# Patient Record
Sex: Female | Born: 1990 | Race: Black or African American | Hispanic: No | Marital: Single | Smoking: Never smoker
Health system: Southern US, Community
[De-identification: ages and names within clinical notes are randomized; demographics above are authoritative.]

## PROBLEM LIST (undated history)

## (undated) DIAGNOSIS — N760 Acute vaginitis: Secondary | ICD-10-CM

## (undated) DIAGNOSIS — A749 Chlamydial infection, unspecified: Secondary | ICD-10-CM

## (undated) DIAGNOSIS — A5901 Trichomonal vulvovaginitis: Secondary | ICD-10-CM

## (undated) DIAGNOSIS — N730 Acute parametritis and pelvic cellulitis: Secondary | ICD-10-CM

## (undated) DIAGNOSIS — B9689 Other specified bacterial agents as the cause of diseases classified elsewhere: Secondary | ICD-10-CM

---
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
## 2010-02-14 ENCOUNTER — Emergency Department (HOSPITAL_COMMUNITY): Admission: EM | Admit: 2010-02-14 | Discharge: 2010-02-14 | Payer: Self-pay | Admitting: Emergency Medicine

## 2010-06-05 LAB — HM PAP SMEAR

## 2010-09-07 LAB — URINALYSIS, ROUTINE W REFLEX MICROSCOPIC
Bilirubin Urine: NEGATIVE
Glucose, UA: NEGATIVE mg/dL
Hgb urine dipstick: NEGATIVE
Ketones, ur: NEGATIVE mg/dL
Nitrite: NEGATIVE
Protein, ur: NEGATIVE mg/dL
Specific Gravity, Urine: 1.025 (ref 1.005–1.030)
Urobilinogen, UA: 0.2 mg/dL (ref 0.0–1.0)

## 2010-09-07 LAB — POCT PREGNANCY, URINE: Preg Test, Ur: NEGATIVE

## 2010-09-07 LAB — WET PREP, GENITAL

## 2010-09-07 LAB — URINE MICROSCOPIC-ADD ON

## 2010-11-21 ENCOUNTER — Emergency Department (HOSPITAL_COMMUNITY)
Admission: EM | Admit: 2010-11-21 | Discharge: 2010-11-21 | Disposition: A | Discharge status: Home / Self Care | Payer: Medicaid Other | Attending: Emergency Medicine | Admitting: Emergency Medicine

## 2010-11-21 DIAGNOSIS — A5901 Trichomonal vulvovaginitis: Secondary | ICD-10-CM | POA: Insufficient documentation

## 2010-11-21 DIAGNOSIS — N72 Inflammatory disease of cervix uteri: Secondary | ICD-10-CM | POA: Insufficient documentation

## 2010-11-21 LAB — POCT PREGNANCY, URINE
Preg Test, Ur: NEGATIVE
Preg Test, Ur: NEGATIVE

## 2010-11-21 LAB — WET PREP, GENITAL: Yeast Wet Prep HPF POC: NONE SEEN

## 2010-11-21 LAB — URINALYSIS, ROUTINE W REFLEX MICROSCOPIC
Glucose, UA: NEGATIVE mg/dL
Leukocytes, UA: NEGATIVE
pH: 6 (ref 5.0–8.0)

## 2011-04-18 ENCOUNTER — Emergency Department (INDEPENDENT_AMBULATORY_CARE_PROVIDER_SITE_OTHER)
Admission: EM | Admit: 2011-04-18 | Discharge: 2011-04-18 | Disposition: A | Discharge status: Home / Self Care | Payer: Medicaid Other | Source: Home / Self Care

## 2011-04-18 ENCOUNTER — Encounter: Payer: Self-pay | Admitting: Emergency Medicine

## 2011-04-18 DIAGNOSIS — B9689 Other specified bacterial agents as the cause of diseases classified elsewhere: Secondary | ICD-10-CM

## 2011-04-18 DIAGNOSIS — N939 Abnormal uterine and vaginal bleeding, unspecified: Secondary | ICD-10-CM

## 2011-04-18 DIAGNOSIS — N898 Other specified noninflammatory disorders of vagina: Secondary | ICD-10-CM

## 2011-04-18 DIAGNOSIS — A499 Bacterial infection, unspecified: Secondary | ICD-10-CM

## 2011-04-18 DIAGNOSIS — N760 Acute vaginitis: Secondary | ICD-10-CM

## 2011-04-18 HISTORY — DX: Acute vaginitis: N76.0

## 2011-04-18 HISTORY — DX: Trichomonal vulvovaginitis: A59.01

## 2011-04-18 HISTORY — DX: Acute vaginitis: B96.89

## 2011-04-18 HISTORY — DX: Chlamydial infection, unspecified: A74.9

## 2011-04-18 LAB — POCT PREGNANCY, URINE: Preg Test, Ur: NEGATIVE

## 2011-04-18 LAB — POCT URINALYSIS DIP (DEVICE)
Glucose, UA: NEGATIVE mg/dL
Nitrite: NEGATIVE
Urobilinogen, UA: 0.2 mg/dL (ref 0.0–1.0)

## 2011-04-18 LAB — WET PREP, GENITAL

## 2011-04-18 MED ORDER — METRONIDAZOLE 500 MG PO TABS: 500.0000 mg | ORAL_TABLET | Freq: Two times a day (BID) | ORAL | Status: AC

## 2011-04-18 NOTE — ED Provider Notes (Signed)
History     CSN: [id number] Arrival date & time: 04/18/2011  6:53 PM   First MD Initiated Contact with Patient 04/18/11 1707      Chief Complaint  Patient presents with  . Abdominal Pain    pt. stated, I started having left side abd. pain since yesterday.     (Consider location/radiation/quality/duration/timing/severity/associated sxs/prior treatment) HPI Comments: Pt with achy nonradiating LLQ pain starting last night. Took ibu w/o relief. Oderous vaginal d/c for several days. Today vaginal bleeding, No N/V, fevers, diarrhea, contipation, lat BM yesterday was WNL for her. No vaginal itching, lower back pain, . No urgency, frequency, dysuria,oderous urine, hematuria,  genital blisters, itching. No recent abx use. Pt sexually active with female partner last contact 1 month ago used condoms, and a female partner last contact 2 weeks ago. States did not share sex toys. They are both asxatic.  STD's not a concern today. States she occasionally gets this LLQ pain since having chlamydia 2 years ago. Seen this summer in ED for similar pain, vaginal bleeding, thought to have abnormal vaginal bleesing.  H/o chalmydia, BV, trich.  No h/o syphilis, herpes, HIV. LMP ended 04/09/2011   Patient is a 20 y.o. female presenting with abdominal pain.  Abdominal Pain The primary symptoms of the illness include abdominal pain, vaginal discharge and vaginal bleeding. The primary symptoms of the illness do not include fever, shortness of breath, nausea, vomiting, diarrhea, hematemesis, hematochezia or dysuria. The current episode started yesterday. The onset of the illness was gradual. The problem has not changed since onset. The vaginal discharge is not associated with dysuria.   The patient states that she believes she is currently not pregnant. Symptoms associated with the illness do not include constipation, urgency, hematuria, frequency or back pain.    Past Medical History  Diagnosis Date  . Chlamydia   .  BV (bacterial vaginosis)   . Trichomonas vaginitis     History reviewed. No pertinent past surgical history.  History reviewed. No pertinent family history.  History  Substance Use Topics  . Smoking status: Never Smoker   . Smokeless tobacco: Not on file  . Alcohol Use: 0.6 oz/week    1 Shots of liquor per week    OB History    Grav Para Term Preterm Abortions TAB SAB Ect Mult Living                  Review of Systems  Constitutional: Negative for fever.  HENT: Negative for sore throat.   Eyes: Negative for redness.  Respiratory: Negative for cough and shortness of breath.   Cardiovascular: Negative for chest pain.  Gastrointestinal: Positive for abdominal pain. Negative for nausea, vomiting, diarrhea, constipation, blood in stool, hematochezia, anal bleeding and hematemesis.  Genitourinary: Positive for vaginal bleeding, vaginal discharge and pelvic pain. Negative for dysuria, urgency, frequency, hematuria, flank pain, vaginal pain and menstrual problem.  Musculoskeletal: Negative for back pain.  Skin: Negative for rash.    Allergies  Review of patient's allergies indicates no known allergies.  Home Medications   Current Outpatient Rx  Name Route Sig Dispense Refill  . IBUPROFEN 600 MG PO TABS Oral Take 600 mg by mouth every 6 (six) hours as needed.      Marland Kitchen METRONIDAZOLE 500 MG PO TABS Oral Take 1 tablet (500 mg total) by mouth 2 (two) times daily. 14 tablet 0    BP 131/92  Pulse 92  Temp(Src) 98.5 F (36.9 C) (Oral)  Resp 18  SpO2  99%  Physical Exam  Nursing note and vitals reviewed. Constitutional: She is oriented to person, place, and time. She appears well-developed and well-nourished. No distress.  HENT:  Head: Normocephalic and atraumatic.  Eyes: EOM are normal. Pupils are equal, round, and reactive to light.  Neck: Normal range of motion. Neck supple.  Cardiovascular: Normal rate, regular rhythm and normal heart sounds.   Pulmonary/Chest: Effort  normal and breath sounds normal.  Abdominal: Soft. Bowel sounds are normal. She exhibits no distension. There is no tenderness. There is no rebound and no guarding.  Genitourinary: Uterus normal. Pelvic exam was performed with patient prone. There is no rash on the right labia. There is no rash on the left labia. Uterus is not tender. Cervix exhibits discharge. Cervix exhibits no motion tenderness and no friability. Right adnexum displays no mass, no tenderness and no fullness. Left adnexum displays no mass, no tenderness and no fullness. There is bleeding around the vagina. No erythema or tenderness around the vagina. No foreign body around the vagina. Vaginal discharge found.       Uterus regular, nontender. No CMT. Mild L adnexal tenderness. No masses. Chaperone present during exam  Musculoskeletal: Normal range of motion.  Neurological: She is alert and oriented to person, place, and time.  Skin: Skin is warm and dry.  Psychiatric: She has a normal mood and affect. Her behavior is normal. Judgment and thought content normal.    ED Course  Procedures (including critical care time)  Results for orders placed during the hospital encounter of 04/18/11  POCT URINALYSIS DIP (DEVICE)      Component Value Range   Glucose, UA NEGATIVE  NEGATIVE (mg/dL)   Bilirubin Urine NEGATIVE  NEGATIVE    Ketones, ur NEGATIVE  NEGATIVE (mg/dL)   Specific Gravity, Urine 1.015  1.005 - 1.030    Hgb urine dipstick LARGE (*) NEGATIVE    pH 8.5 (*) 5.0 - 8.0    Protein, ur 30 (*) NEGATIVE (mg/dL)   Urobilinogen, UA 0.2  0.0 - 1.0 (mg/dL)   Nitrite NEGATIVE  NEGATIVE    Leukocytes, UA NEGATIVE  NEGATIVE   POCT PREGNANCY, URINE      Component Value Range   Preg Test, Ur NEGATIVE       1. Vaginal bleeding, abnormal   2. BV (bacterial vaginosis)       MDM        Danella Maiers [facility] 04/18/11 2029

## 2011-04-19 LAB — GC/CHLAMYDIA PROBE AMP, GENITAL: Chlamydia, DNA Probe: NEGATIVE

## 2011-06-21 ENCOUNTER — Ambulatory Visit (HOSPITAL_COMMUNITY)
Admission: RE | Admit: 2011-06-21 | Discharge: 2011-06-21 | Disposition: A | Discharge status: Home / Self Care | Payer: Medicaid Other | Source: Ambulatory Visit | Attending: Chiropractic Medicine | Admitting: Chiropractic Medicine

## 2011-06-21 ENCOUNTER — Other Ambulatory Visit (HOSPITAL_COMMUNITY): Payer: Self-pay | Admitting: Chiropractic Medicine

## 2011-06-21 DIAGNOSIS — M542 Cervicalgia: Secondary | ICD-10-CM

## 2012-06-26 ENCOUNTER — Other Ambulatory Visit (INDEPENDENT_AMBULATORY_CARE_PROVIDER_SITE_OTHER): Payer: No Typology Code available for payment source

## 2012-06-26 ENCOUNTER — Encounter: Payer: Self-pay | Admitting: Internal Medicine

## 2012-06-26 ENCOUNTER — Ambulatory Visit (INDEPENDENT_AMBULATORY_CARE_PROVIDER_SITE_OTHER): Payer: No Typology Code available for payment source | Admitting: Internal Medicine

## 2012-06-26 ENCOUNTER — Other Ambulatory Visit: Payer: Self-pay | Admitting: Internal Medicine

## 2012-06-26 VITALS — BP 116/86 | HR 84 | Temp 97.7°F | Ht 62.0 in | Wt 166.0 lb

## 2012-06-26 DIAGNOSIS — Z0000 Encounter for general adult medical examination without abnormal findings: Secondary | ICD-10-CM

## 2012-06-26 DIAGNOSIS — Z13220 Encounter for screening for lipoid disorders: Secondary | ICD-10-CM

## 2012-06-26 DIAGNOSIS — Z131 Encounter for screening for diabetes mellitus: Secondary | ICD-10-CM

## 2012-06-26 DIAGNOSIS — Z130 Encounter for screening for diseases of the blood and blood-forming organs and certain disorders involving the immune mechanism: Secondary | ICD-10-CM

## 2012-06-26 DIAGNOSIS — B9689 Other specified bacterial agents as the cause of diseases classified elsewhere: Secondary | ICD-10-CM

## 2012-06-26 DIAGNOSIS — A499 Bacterial infection, unspecified: Secondary | ICD-10-CM

## 2012-06-26 DIAGNOSIS — N760 Acute vaginitis: Secondary | ICD-10-CM

## 2012-06-26 DIAGNOSIS — R109 Unspecified abdominal pain: Secondary | ICD-10-CM

## 2012-06-26 LAB — LIPID PANEL
Cholesterol: 197 mg/dL (ref 0–200)
LDL Cholesterol: 122 mg/dL — ABNORMAL HIGH (ref 0–99)
Triglycerides: 50 mg/dL (ref 0.0–149.0)

## 2012-06-26 LAB — CBC
Hemoglobin: 11.4 g/dL — ABNORMAL LOW (ref 12.0–15.0)
MCHC: 32.5 g/dL (ref 30.0–36.0)
MCV: 74.8 fl — ABNORMAL LOW (ref 78.0–100.0)
RDW: 17.9 % — ABNORMAL HIGH (ref 11.5–14.6)

## 2012-06-26 LAB — BASIC METABOLIC PANEL
Chloride: 104 mEq/L (ref 96–112)
Creatinine, Ser: 0.5 mg/dL (ref 0.4–1.2)
GFR: 198.37 mL/min (ref >60.00)

## 2012-06-26 MED ORDER — METRONIDAZOLE 0.75 % VA GEL: 1.0000 | Freq: Two times a day (BID) | VAGINAL | Status: DC

## 2012-06-26 NOTE — Progress Notes (Signed)
HPI  Pt presents to the clinic today to establish care. [facility] but now that she's 22 she has to establish a PCP. She does have some concerns today about pain in her left abdomen. She thinks she may have a cyst on her ovary. She has pain during intercourse and the area feels swollen. She has take Ibuprofen for the pain with mild relief. 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 This is a chronic problem for her. She has used Mudlogger on multiple occasions.   Flu: never Tdap: 2009 LMP: 06/11/12 Pap smear: 2012  Past Medical History  Diagnosis Date  . Chlamydia   . BV (bacterial vaginosis)   . Trichomonas vaginitis     Current Outpatient Prescriptions  Medication Sig Dispense Refill  . metroNIDAZOLE (METROGEL) 0.75 % vaginal gel Place 1 Applicatorful vaginally 2 (two) times daily.  70 g  0    No Known Allergies  Family History  Problem Relation Age of Onset  . Hypertension Mother   . Glaucoma Mother   . Cancer Maternal Uncle   . Diabetes Maternal Grandmother     History   Social History  . Marital Status: Single    Spouse Name: N/A    Number of Children: N/A  . Years of Education: 15   Occupational History  . [employer]   Social History Main Topics  . Smoking status: Never Smoker   . Smokeless tobacco: Never Used  . Alcohol Use: 0.6 oz/week    1 Shots of liquor per week  . Drug Use: No  . Sexually Active: Yes    Birth Control/ Protection: Condom   Other Topics Concern  . Not on file   Social History Narrative   Regular exercise-noCaffeine Us-yes    ROS:  Constitutional: Denies fever, malaise, fatigue, headache or abrupt weight changes.  HEENT: Denies eye pain, eye redness, ear pain, ringing in the ears, wax buildup, runny nose, nasal congestion, bloody nose, or sore throat. Respiratory: Denies difficulty breathing, shortness of breath, cough or sputum production.   Cardiovascular: Denies chest pain, chest  tightness, palpitations or swelling in the hands or feet.  Gastrointestinal: Denies abdominal pain, bloating, constipation, diarrhea or blood in the stool.  GU: Pt reports pain in her left ovary. Pt reports creamy thin discharge with foul odor. Denies frequency, urgency, pain with urination, blood in urine. Musculoskeletal: Denies decrease in range of motion, difficulty with gait, muscle pain or joint pain and swelling.  Skin: Denies redness, rashes, lesions or ulcercations.  Neurological: Denies dizziness, difficulty with memory, difficulty with speech or problems with balance and coordination.   No other specific complaints in a complete review of systems (except as listed in HPI above).  PE:  BP 116/86  Pulse 84  Temp 97.7 F (36.5 C) (Oral)  Ht 5\' 2"  (1.575 m)  Wt 166 lb (75.297 kg)  BMI 30.36 kg/m2  SpO2 99%  LMP 06/11/2012 Wt Readings from Last 3 Encounters:  06/26/12 166 lb (75.297 kg)    General: Appears her stated age, overweight but well developed, well nourished in NAD. HEENT: Head: normal shape and size; Eyes: sclera white, no icterus, conjunctiva pink, PERRLA and EOMs intact; Ears: Tm's gray and intact, normal light reflex; Nose: mucosa pink and moist, septum midline; Throat/Mouth: Teeth present, mucosa pink and moist, no lesions or ulcerations noted.  Neck: Normal range of motion. Neck supple, trachea midline. No massses, lumps or thyromegaly present.  Cardiovascular: Normal rate and rhythm. S1,S2 noted.  No murmur, rubs or gallops noted. No JVD or BLE edema. No carotid bruits noted. Pulmonary/Chest: Normal effort and positive vesicular breath sounds. No respiratory distress. No wheezes, rales or ronchi noted.  Abdomen: Soft. Rebound tenderness of the LLQ. Normal bowel sounds, no bruits noted. No distention or masses noted. Liver, spleen and kidneys non palpable. Musculoskeletal: Normal range of motion. No signs of joint swelling. No difficulty with gait.  Neurological:  Alert and oriented. Cranial nerves II-XII intact. Coordination normal. +DTRs bilaterally. Psychiatric: Mood and affect normal. Behavior is normal. Judgment and thought content normal.     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

## 2012-06-26 NOTE — Patient Instructions (Signed)
Health Maintenance, Females A healthy lifestyle and preventative care can promote health and wellness.  Maintain regular health, dental, and eye exams.  Eat a healthy diet. Foods like vegetables, fruits, whole grains, low-fat dairy products, and lean protein foods contain the nutrients you need without too many calories. Decrease your intake of foods high in solid fats, added sugars, and salt. Get information about a proper diet from your caregiver, if necessary.  Regular physical exercise is one of the most important things you can do for your health. Most adults should get at least 150 minutes of moderate-intensity exercise (any activity that increases your heart rate and causes you to sweat) each week. In addition, most adults need muscle-strengthening exercises on 2 or more days a week.   Maintain a healthy weight. The body mass index (BMI) is a screening tool to identify possible weight problems. It provides an estimate of body fat based on height and weight. Your caregiver can help determine your BMI, and can help you achieve or maintain a healthy weight. For adults 20 years and older:  A BMI below 18.5 is considered underweight.  A BMI of 18.5 to 24.9 is normal.  A BMI of 25 to 29.9 is considered overweight.  A BMI of 30 and above is considered obese.  Maintain normal blood lipids and cholesterol by exercising and minimizing your intake of saturated fat. Eat a balanced diet with plenty of fruits and vegetables. Blood tests for lipids and cholesterol should begin at age 20 and be repeated every 5 years. If your lipid or cholesterol levels are high, you are over 50, or you are a high risk for heart disease, you may need your cholesterol levels checked more frequently.Ongoing high lipid and cholesterol levels should be treated with medicines if diet and exercise are not effective.  If you smoke, find out from your caregiver how to quit. If you do not use tobacco, do not start.  If you  are pregnant, do not drink alcohol. If you are breastfeeding, be very cautious about drinking alcohol. If you are not pregnant and choose to drink alcohol, do not exceed 1 drink per day. One drink is considered to be 12 ounces (355 mL) of beer, 5 ounces (148 mL) of wine, or 1.5 ounces (44 mL) of liquor.  Avoid use of street drugs. Do not share needles with anyone. Ask for help if you need support or instructions about stopping the use of drugs.  High blood pressure causes heart disease and increases the risk of stroke. Blood pressure should be checked at least every 1 to 2 years. Ongoing high blood pressure should be treated with medicines, if weight loss and exercise are not effective.  If you are 55 to 22 years old, ask your caregiver if you should take aspirin to prevent strokes.  Diabetes screening involves taking a blood sample to check your fasting blood sugar level. This should be done once every 3 years, after age 45, if you are within normal weight and without risk factors for diabetes. Testing should be considered at a younger age or be carried out more frequently if you are overweight and have at least 1 risk factor for diabetes.  Breast cancer screening is essential preventative care for women. You should practice "breast self-awareness." This means understanding the normal appearance and feel of your breasts and may include breast self-examination. Any changes detected, no matter how small, should be reported to a caregiver. Women in their 20s and 30s should have   a clinical breast exam (CBE) by a caregiver as part of a regular health exam every 1 to 3 years. After age 40, women should have a CBE every year. Starting at age 40, women should consider having a mammogram (breast X-ray) every year. Women who have a family history of breast cancer should talk to their caregiver about genetic screening. Women at a high risk of breast cancer should talk to their caregiver about having an MRI and a  mammogram every year.  The Pap test is a screening test for cervical cancer. Women should have a Pap test starting at age 21. Between ages 21 and 29, Pap tests should be repeated every 2 years. Beginning at age 30, you should have a Pap test every 3 years as long as the past 3 Pap tests have been normal. If you had a hysterectomy for a problem that was not cancer or a condition that could lead to cancer, then you no longer need Pap tests. If you are between ages 65 and 70, and you have had normal Pap tests going back 10 years, you no longer need Pap tests. If you have had past treatment for cervical cancer or a condition that could lead to cancer, you need Pap tests and screening for cancer for at least 20 years after your treatment. If Pap tests have been discontinued, risk factors (such as a new sexual partner) need to be reassessed to determine if screening should be resumed. Some women have medical problems that increase the chance of getting cervical cancer. In these cases, your caregiver may recommend more frequent screening and Pap tests.  The human papillomavirus (HPV) test is an additional test that may be used for cervical cancer screening. The HPV test looks for the virus that can cause the cell changes on the cervix. The cells collected during the Pap test can be tested for HPV. The HPV test could be used to screen women aged 30 years and older, and should be used in women of any age who have unclear Pap test results. After the age of 30, women should have HPV testing at the same frequency as a Pap test.  Colorectal cancer can be detected and often prevented. Most routine colorectal cancer screening begins at the age of 50 and continues through age 75. However, your caregiver may recommend screening at an earlier age if you have risk factors for colon cancer. On a yearly basis, your caregiver may provide home test kits to check for hidden blood in the stool. Use of a small camera at the end of a  tube, to directly examine the colon (sigmoidoscopy or colonoscopy), can detect the earliest forms of colorectal cancer. Talk to your caregiver about this at age 50, when routine screening begins. Direct examination of the colon should be repeated every 5 to 10 years through age 75, unless early forms of pre-cancerous polyps or small growths are found.  Hepatitis C blood testing is recommended for all people born from 1945 through 1965 and any individual with known risks for hepatitis C.  Practice safe sex. Use condoms and avoid high-risk sexual practices to reduce the spread of sexually transmitted infections (STIs). Sexually active women aged 25 and younger should be checked for Chlamydia, which is a common sexually transmitted infection. Older women with new or multiple partners should also be tested for Chlamydia. Testing for other STIs is recommended if you are sexually active and at increased risk.  Osteoporosis is a disease in which the   bones lose minerals and strength with aging. This can result in serious bone fractures. The risk of osteoporosis can be identified using a bone density scan. Women ages 65 and over and women at risk for fractures or osteoporosis should discuss screening with their caregivers. Ask your caregiver whether you should be taking a calcium supplement or vitamin D to reduce the rate of osteoporosis.  Menopause can be associated with physical symptoms and risks. Hormone replacement therapy is available to decrease symptoms and risks. You should talk to your caregiver about whether hormone replacement therapy is right for you.  Use sunscreen with a sun protection factor (SPF) of 30 or greater. Apply sunscreen liberally and repeatedly throughout the day. You should seek shade when your shadow is shorter than you. Protect yourself by wearing long sleeves, pants, a wide-brimmed hat, and sunglasses year round, whenever you are outdoors.  Notify your caregiver of new moles or  changes in moles, especially if there is a change in shape or color. Also notify your caregiver if a mole is larger than the size of a pencil eraser.  Stay current with your immunizations. Document Released: 12/05/2010 Document Revised: 08/14/2011 Document Reviewed: 12/05/2010 Exit[employer] Patient Information 2013 [employer]. Breast Self-Exam A self breast exam may help you find changes or problems while they are still small. Do a breast self-exam:  Every month.  One week after your period (menstrual period).  On the first day of each month if you do not have periods anymore. Look for any:  Change in breast color, size, or shape.  Dimples in your breast.  Changes in your nipples or skin.  Dry skin on your breasts or nipples.  Watery or bloody discharge from your nipples.  Feel for:  Lumps.  Thick, hard places.  Any other changes. HOME CARE There are 3 ways to do the breast self-exam: In front of a mirror.  Lift your arms over your head and turn side to side.  Put your hands on your hips and lean down, then turn from side to side.  Bend forward and turn from side to side. In the shower.  With soapy hands, check both breasts. Then check above and below your collarbone and your armpits.  Feel above and below your collarbone down to under your breast, and from the center of your chest to the outer edge of the armpit. Check for any lumps or hard spots.  Using the tips of your middle three fingers check your whole breast by pressing your hand over your breast in a circle or in an up and down motion. Lying down.  Lie flat on your bed.  Put a small pillow under the breast you are going to check. On that same side, put your hand behind your head.  With your other hand, use the 3 middle fingers to feel the breast.  Move your fingers in a circle around the breast. Press firmly over all parts of the breast to feel for any lumps. GET HELP RIGHT AWAY IF: You find any  changes in your breasts so they can be checked. Document Released: 11/08/2007 Document Revised: 08/14/2011 Document Reviewed: 09/09/2008 [employer] Patient Information 2013 [employer].  

## 2012-07-09 ENCOUNTER — Other Ambulatory Visit: Payer: Self-pay | Admitting: Internal Medicine

## 2012-07-09 ENCOUNTER — Ambulatory Visit (HOSPITAL_COMMUNITY)
Admission: RE | Admit: 2012-07-09 | Discharge: 2012-07-09 | Disposition: A | Discharge status: Home / Self Care | Payer: No Typology Code available for payment source | Source: Ambulatory Visit | Attending: Internal Medicine | Admitting: Internal Medicine

## 2012-07-09 DIAGNOSIS — R1032 Left lower quadrant pain: Secondary | ICD-10-CM | POA: Insufficient documentation

## 2012-07-09 DIAGNOSIS — R109 Unspecified abdominal pain: Secondary | ICD-10-CM

## 2012-07-17 ENCOUNTER — Ambulatory Visit (INDEPENDENT_AMBULATORY_CARE_PROVIDER_SITE_OTHER): Payer: No Typology Code available for payment source | Admitting: Internal Medicine

## 2012-07-17 ENCOUNTER — Encounter: Payer: Self-pay | Admitting: Internal Medicine

## 2012-07-17 ENCOUNTER — Ambulatory Visit: Payer: No Typology Code available for payment source

## 2012-07-17 VITALS — BP 110/82 | HR 76 | Temp 97.9°F | Ht 62.0 in | Wt 165.4 lb

## 2012-07-17 DIAGNOSIS — Z113 Encounter for screening for infections with a predominantly sexual mode of transmission: Secondary | ICD-10-CM

## 2012-07-17 DIAGNOSIS — R1032 Left lower quadrant pain: Secondary | ICD-10-CM

## 2012-07-17 NOTE — Patient Instructions (Signed)
Abdominal Pain (Nonspecific)  Your exam might not show the exact reason you have abdominal pain. Since there are many different causes of abdominal pain, another checkup and more tests may be needed. It is very important to follow up for lasting (persistent) or worsening symptoms. A possible cause of abdominal pain in any person who still has his or her appendix is acute appendicitis. Appendicitis is often hard to diagnose. Normal blood tests, urine tests, ultrasound, and CT scans do not completely rule out early appendicitis or other causes of abdominal pain. Sometimes, only the changes that happen over time will allow appendicitis and other causes of abdominal pain to be determined. Other potential problems that may require surgery may also take time to become more apparent. Because of this, it is important that you follow all of the instructions below.  HOME CARE INSTRUCTIONS    Rest as much as possible.   Do not eat solid food until your pain is gone.   While adults or children have pain: A diet of water, weak decaffeinated tea, broth or bouillon, gelatin, oral rehydration solutions (ORS), frozen ice pops, or ice chips may be helpful.   When pain is gone in adults or children: Start a light diet (dry toast, crackers, applesauce, or white rice). Increase the diet slowly as long as it does not bother you. Eat no dairy products (including cheese and eggs) and no spicy, fatty, fried, or high-fiber foods.   Use no alcohol, caffeine, or cigarettes.   Take your regular medicines unless your caregiver told you not to.   Take any prescribed medicine as directed.   Only take over-the-counter or prescription medicines for pain, discomfort, or fever as directed by your caregiver. Do not give aspirin to children.  If your caregiver has given you a follow-up appointment, it is very important to keep that appointment. Not keeping the appointment could result in a permanent injury and/or lasting (chronic) pain and/or  disability. If there is any problem keeping the appointment, you must call to reschedule.   SEEK IMMEDIATE MEDICAL CARE IF:    Your pain is not gone in 24 hours.   Your pain becomes worse, changes location, or feels different.   You or your child has an oral temperature above 102 F (38.9 C), not controlled by medicine.   Your baby is older than 3 months with a rectal temperature of 102 F (38.9 C) or higher.   Your baby is 3 months old or younger with a rectal temperature of 100.4 F (38 C) or higher.   You have shaking chills.   You keep throwing up (vomiting) or cannot drink liquids.   There is blood in your vomit or you see blood in your bowel movements.   Your bowel movements become dark or black.   You have frequent bowel movements.   Your bowel movements stop (become blocked) or you cannot pass gas.   You have bloody, frequent, or painful urination.   You have yellow discoloration in the skin or whites of the eyes.   Your stomach becomes bloated or bigger.   You have dizziness or fainting.   You have chest or back pain.  MAKE SURE YOU:    Understand these instructions.   Will watch your condition.   Will get help right away if you are not doing well or get worse.  Document Released: 05/22/2005 Document Revised: 08/14/2011 Document Reviewed: 04/19/2009  [employer] Patient Information 2013 [employer].

## 2012-07-17 NOTE — Progress Notes (Signed)
Subjective:    Patient ID: Angela Peck, female    DOB: 12/12/1990, 22 y.o.   MRN: [id number]  HPI  Pt presents to the clinic today for ongoing abdominal pain in the LLQ. At the last visit, we got a abdominal and pelvic ultrasound which was normal. We treated her for BV infection, and the discharge is gone but the pain continues.This pain has been intermittent for the last 1-2 years. Her bowel movements are more like diarrhea. She does have a BM every day. Sometimes the pain is relieved by having a BM. The pain is worse during intercourse. She has made changes in her diet to avoid fried and fatty foods but the pain still continues. She denies any nausea or vomiting. She has not seen any blood in her stool. Additionally today, she would like a complete STD screen.  Review of Systems      Past Medical History  Diagnosis Date  . Chlamydia   . BV (bacterial vaginosis)   . Trichomonas vaginitis     Current Outpatient Prescriptions  Medication Sig Dispense Refill  . metroNIDAZOLE (METROGEL) 0.75 % vaginal gel Place 1 Applicatorful vaginally 2 (two) times daily.  70 g  0   No current facility-administered medications for this visit.    No Known Allergies  Family History  Problem Relation Age of Onset  . Hypertension Mother   . Glaucoma Mother   . Cancer Maternal Uncle   . Diabetes Maternal Grandmother     History   Social History  . Marital Status: Single    Spouse Name: N/A    Number of Children: N/A  . Years of Education: 15   Occupational History  . [employer]   Social History Main Topics  . Smoking status: Never Smoker   . Smokeless tobacco: Never Used  . Alcohol Use: 0.6 oz/week    1 Shots of liquor per week  . Drug Use: No  . Sexually Active: Yes    Birth Control/ Protection: Condom   Other Topics Concern  . Not on file   Social History Narrative   Regular exercise-no   Caffeine Us-yes     Constitutional: Denies fever, malaise, fatigue, headache or  abrupt weight changes.  Gastrointestinal: Pt reports abdominal pain and diarrhea. Denies bloating, constipation, or blood in the stool.  GU: Denies urgency, frequency, pain with urination, burning sensation, blood in urine, odor or discharge.  No other specific complaints in a complete review of systems (except as listed in HPI above).  Objective:   Physical Exam   BP 110/82  Pulse 76  Temp(Src) 97.9 F (36.6 C) (Oral)  Ht 5\' 2"  (1.575 m)  Wt 165 lb 6.4 oz (75.025 kg)  BMI 30.24 kg/m2  SpO2 99%  LMP 07/12/2012 Wt Readings from Last 3 Encounters:  07/17/12 165 lb 6.4 oz (75.025 kg)  06/26/12 166 lb (75.297 kg)    General: Appears her stated age, well developed, well nourished in NAD.  Cardiovascular: Normal rate and rhythm. S1,S2 noted.  No murmur, rubs or gallops noted. No JVD or BLE edema. No carotid bruits noted. Pulmonary/Chest: Normal effort and positive vesicular breath sounds. No respiratory distress. No wheezes, rales or ronchi noted.  Abdomen: Soft and  Mildly tender in the LLQ. Normal bowel sounds, no bruits noted. No distention or masses noted. Liver, spleen and kidneys non palpable.       Assessment & Plan:   Abdominal pain, LLQ with mild diarrhea, additional workup required:  PT counseled  about diet changes Referral placed to GI for evaluation and treatment. ? IBS  Screen for STD's, additional workup required:  Will obtain urine gonorrhea and chlamydia Will obtain serum HIV/RPR/HSV  RTC as needed or if symptoms persist

## 2012-07-17 NOTE — Addendum Note (Signed)
Addended by: Lorre Munroe on: 07/17/2012 10:56 AM   Modules accepted: Orders

## 2012-07-19 LAB — HSV(HERPES SIMPLEX VRS) I + II AB-IGM: Herpes Simplex Vrs I&II-IgM Ab (EIA): 1.54 INDEX — ABNORMAL HIGH

## 2012-07-19 LAB — GC/CHLAMYDIA PROBE AMP
CT Probe RNA: NEGATIVE
GC Probe RNA: NEGATIVE

## 2012-07-22 LAB — HIV ANTIBODY (ROUTINE TESTING W REFLEX): HIV: NONREACTIVE

## 2012-07-23 ENCOUNTER — Encounter: Payer: Self-pay | Admitting: Gastroenterology

## 2012-07-24 LAB — HSV(HERPES SIMPLEX VRS) I + II AB-IGG: HSV 1 Glycoprotein G Ab, IgG: 1.36 IV — ABNORMAL HIGH

## 2012-08-06 ENCOUNTER — Ambulatory Visit (INDEPENDENT_AMBULATORY_CARE_PROVIDER_SITE_OTHER): Payer: No Typology Code available for payment source | Admitting: Gastroenterology

## 2012-08-06 ENCOUNTER — Encounter: Payer: Self-pay | Admitting: Gastroenterology

## 2012-08-06 VITALS — BP 96/70 | HR 80 | Ht 62.0 in | Wt 170.0 lb

## 2012-08-06 DIAGNOSIS — K589 Irritable bowel syndrome without diarrhea: Secondary | ICD-10-CM

## 2012-08-06 DIAGNOSIS — R1032 Left lower quadrant pain: Secondary | ICD-10-CM

## 2012-08-06 MED ORDER — HYOSCYAMINE SULFATE 0.125 MG SL SUBL: 0.1250 mg | SUBLINGUAL_TABLET | Freq: Four times a day (QID) | SUBLINGUAL | Status: DC | PRN

## 2012-08-06 NOTE — Progress Notes (Signed)
HPI: This is a  very pleasant 22 year old woman whom I am meeting for the first time today.  Cramps intermittently for 2 years.  Not daily, but will occur 2-3 times in a week.  Can last a few hours.  Has loose BMs around.  The pain remains after BM but some of the pressure is relieved.  Does not have rectal bleeding. NO relation with pains and menstral.  Nothing will make it better (except time).  Nothing makes it worse.  HAs a BM about every day, usually solid except around times of cramps then it is loose.  HAs been gassier lately.  Cannot pick out any particular foods that make it occur.  Overall her weight is up about 20 pounds in the past year.   Blood work January 2014: CBC showed slight microcytic anemia, basic metabolic profile was normal. Transvaginal ultrasound as well as percutaneous ultrasound  February 2014 was essentially normal.   She is most bothered by irregular menstrual bleeding, vaginal discharge and odor. She would like referral to a different gynecologist for another opinion.   Review of systems: Pertinent positive and negative review of systems were noted in the above HPI section. Complete review of systems was performed and was otherwise normal.    Past Medical History  Diagnosis Date  . Chlamydia   . BV (bacterial vaginosis)   . Trichomonas vaginitis     Past Surgical History  Procedure Laterality Date  . Cyst excision  1995    from chest    Current Outpatient Prescriptions  Medication Sig Dispense Refill  . Biotin 10 MG CAPS Take 1 capsule by mouth daily.      . ferrous sulfate 325 (65 FE) MG tablet Take 325 mg by mouth daily with breakfast.      . metroNIDAZOLE (METROGEL) 0.75 % vaginal gel Place 1 Applicatorful vaginally 2 (two) times daily.  70 g  0   No current facility-administered medications for this visit.    Allergies as of 08/06/2012  . (No Known Allergies)    Family History  Problem Relation Age of Onset  . Hypertension  Mother   . Glaucoma Mother   . Cancer Maternal Uncle   . Diabetes Maternal Grandmother   . Liver disease Maternal Grandmother   . Kidney disease Maternal Grandmother     History   Social History  . Marital Status: Single    Spouse Name: N/A    Number of Children: N/A  . Years of Education: 15   Occupational History  . [employer]    Student   Social History Main Topics  . Smoking status: Never Smoker   . Smokeless tobacco: Never Used  . Alcohol Use: 0.6 oz/week    1 Shots of liquor per week  . Drug Use: No  . Sexually Active: Yes    Birth Control/ Protection: Condom   Other Topics Concern  . Not on file   Social History Narrative   Regular exercise-no   Caffeine Us-yes       Physical Exam: BP 96/70  Pulse 80  Ht 5\' 2"  (1.575 m)  Wt 170 lb (77.111 kg)  BMI 31.09 kg/m2  LMP 07/12/2012 Constitutional: generally well-appearing Psychiatric: alert and oriented x3 Eyes: extraocular movements intact Mouth: oral pharynx moist, no lesions Neck: supple no lymphadenopathy Cardiovascular: heart regular rate and rhythm Lungs: clear to auscultation bilaterally Abdomen: soft, nontender, nondistended, no obvious ascites, no peritoneal signs, normal bowel sounds Extremities: no lower extremity edema bilaterally  Skin: no lesions on visible extremities    Assessment and plan: 22 y.o. female with  left-sided abdominal discomfort, likely IBS. Also intermittent loose stools.  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 I would like to proceed with flexible sigmoidoscopy to rule out structural causes, significant inflammation. I am calling her in a prescription for sublingual antispasm medicines to take on an as-needed basis. She would really like another opinion on her irregular vaginal bleeding and discharge with foul odor. I am happy to set up a referral to Dr. Vincente Poli.

## 2012-08-06 NOTE — Patient Instructions (Addendum)
You likely have IBS, but this does not explain your vaginal discharge, odor.  Will set you up with new gynecologist for second opinion. Trial of antispasm med, called in. You will be set up for flexible sigmoidoscopy (LEC, moderate).                                               We are excited to introduce MyChart, a new best-in-class service that provides you online access to important information in your electronic medical record. We want to make it easier for you to view your health information - all in one secure location - when and where you need it. We expect MyChart will enhance the quality of care and service we provide.  When you register for MyChart, you can:    View your test results.    Request appointments and receive appointment reminders via email.    Request medication renewals.    View your medical history, allergies, medications and immunizations.    Communicate with your physician's office through a password-protected site.    Conveniently print information such as your medication lists.  To find out if MyChart is right for you, please talk to a member of our clinical staff today. We will gladly answer your questions about this free health and wellness tool.  If you are age 22 or older and want a member of your family to have access to your record, you must provide written consent by completing a proxy form available at our office. Please speak to our clinical staff about guidelines regarding accounts for patients younger than age 22.  As you activate your MyChart account and need any technical assistance, please call the MyChart technical support line at [phone]) or email your question to [email or web address]. If you email your question(s), please include your name, a return phone number and the best time to reach you.  If you have non-urgent health-related questions, you can send a message to our office through MyChart at  [email or web address]. If you have a medical emergency, call 911.  Thank you for using MyChart as your new health and wellness resource!   MyChart licensed from [employer],  9562-1308. Patents Pending.

## 2012-08-14 ENCOUNTER — Encounter: Payer: Self-pay | Admitting: Gastroenterology

## 2012-08-14 ENCOUNTER — Telehealth: Payer: Self-pay

## 2012-08-14 ENCOUNTER — Ambulatory Visit (AMBULATORY_SURGERY_CENTER): Payer: No Typology Code available for payment source | Admitting: Gastroenterology

## 2012-08-14 VITALS — BP 104/68 | HR 83 | Temp 99.2°F | Resp 14 | Ht 62.0 in | Wt 170.0 lb

## 2012-08-14 DIAGNOSIS — R1032 Left lower quadrant pain: Secondary | ICD-10-CM

## 2012-08-14 DIAGNOSIS — K589 Irritable bowel syndrome without diarrhea: Secondary | ICD-10-CM

## 2012-08-14 MED ORDER — SODIUM CHLORIDE 0.9 % IV SOLN: 500.0000 mL | INTRAVENOUS | Status: DC

## 2012-08-14 NOTE — Progress Notes (Signed)
Patient did not experience any of the following events: a burn prior to discharge; a fall within the facility; wrong site/side/patient/procedure/implant event; or a hospital transfer or hospital admission upon discharge from the facility. (G8907) Patient did not have preoperative order for IV antibiotic SSI prophylaxis. (G8918)  

## 2012-08-14 NOTE — Telephone Encounter (Signed)
My office will continue to work on referral for gynecology second opinion

## 2012-08-14 NOTE — Telephone Encounter (Signed)
appt with Dr Lily Peer 08/15/12 930 pt aware

## 2012-08-14 NOTE — Op Note (Addendum)
[facility] [address] [place], [address]   FLEXIBLE SIGMOIDOSCOPY PROCEDURE REPORT  PATIENT: Angela, Peck  MR#: [id number] BIRTHDATE: [birth year] , 22  yrs. old GENDER: Female ENDOSCOPIST: Rachael Fee, MD REFERRED BY:  Nicki Reaper, MD PROCEDURE DATE:  08/14/2012 PROCEDURE:   Sigmoidoscopy, screening ASA CLASS:   Class II INDICATIONS:abdominal cramping. MEDICATIONS: Fentanyl 50 mcg IV, Versed 6 mg IV, and These medications were titrated to patient response per physician's verbal order  DESCRIPTION OF PROCEDURE:   After the risks benefits and alternatives of the procedure were thoroughly explained, informed consent was obtained.  revealed no abnormalities of the rectum. The [id number]  endoscope was introduced through the anus and advanced to the splenic flexure , limited by No adverse events experienced.   The quality of the prep was fair .  The instrument was then slowly withdrawn as the mucosa was fully examined.     COLON FINDINGS: The examination was normal.  No polyps or cancers or colitis. Retroflexed views revealed no abnormalities.    The scope was then withdrawn from the patient and the procedure terminated. COMPLICATIONS: There were no complications.  ENDOSCOPIC IMPRESSION: The examination was normal. No polyps or cancers or colitis.  RECOMMENDATIONS: My office will continue to work on referral for gynecology second opinion.  Continue sublingual antispasm medicines as needed.     _______________________________ eSignedRachael Fee, MD 08/14/2012 9:40 AM

## 2012-08-14 NOTE — Patient Instructions (Signed)
YOU HAD AN ENDOSCOPIC PROCEDURE TODAY AT THE [facility]: Refer to the procedure report that was given to you for any specific questions about what was found during the examination.  If the procedure report does not answer your questions, please call your gastroenterologist to clarify.  If you requested that your care partner not be given the details of your procedure findings, then the procedure report has been included in a sealed envelope for you to review at your convenience later.  YOU SHOULD EXPECT: Some feelings of bloating in the abdomen. Passage of more gas than usual.  Walking can help get rid of the air that was put into your GI tract during the procedure and reduce the bloating. If you had a lower endoscopy (such as a colonoscopy or flexible sigmoidoscopy) you may notice spotting of blood in your stool or on the toilet paper. If you underwent a bowel prep for your procedure, then you may not have a normal bowel movement for a few days.  DIET: Your first meal following the procedure should be a light meal and then it is ok to progress to your normal diet.  A half-sandwich or bowl of soup is an example of a good first meal.  Heavy or fried foods are harder to digest and may make you feel nauseous or bloated.  Likewise meals heavy in dairy and vegetables can cause extra gas to form and this can also increase the bloating.  Drink plenty of fluids but you should avoid alcoholic beverages for 24 hours.  ACTIVITY: Your care partner should take you home directly after the procedure.  You should plan to take it easy, moving slowly for the rest of the day.  You can resume normal activity the day after the procedure however you should NOT DRIVE or use heavy machinery for 24 hours (because of the sedation medicines used during the test).    SYMPTOMS TO REPORT IMMEDIATELY: A gastroenterologist can be reached at any hour.  During normal business hours, 8:30 AM to 5:00 PM Monday through Friday,  call [phone].  After hours and on weekends, please call the GI answering service at [phone] who will take a message and have the physician on call contact you.   Following lower endoscopy (colonoscopy or flexible sigmoidoscopy):  Excessive amounts of blood in the stool  Significant tenderness or worsening of abdominal pains  Swelling of the abdomen that is new, acute  Fever of 100F or higher    FOLLOW UP: If any biopsies were taken you will be contacted by phone or by letter within the next 1-3 weeks.  Call your gastroenterologist if you have not heard about the biopsies in 3 weeks.  Our staff will call the home number listed on your records the next business day following your procedure to check on you and address any questions or concerns that you may have at that time regarding the information given to you following your procedure. This is a courtesy call and so if there is no answer at the home number and we have not heard from you through the emergency physician on call, we will assume that you have returned to your regular daily activities without incident.  SIGNATURES/CONFIDENTIALITY: You and/or your care partner have signed paperwork which will be entered into your electronic medical record.  These signatures attest to the fact that that the information above on your After Visit Summary has been reviewed and is understood.  Full responsibility of the confidentiality   of this discharge information lies with you and/or your care-partner.     

## 2012-08-15 ENCOUNTER — Telehealth: Payer: Self-pay | Admitting: *Deleted

## 2012-08-15 ENCOUNTER — Ambulatory Visit: Payer: No Typology Code available for payment source | Admitting: Gynecology

## 2012-08-15 NOTE — Telephone Encounter (Signed)
  Follow up Call-  Call back number 08/14/2012  Post procedure Call Back phone  # [phone]  Permission to leave phone message Yes     Phone Busy Times 3 attempts

## 2012-11-28 ENCOUNTER — Emergency Department (HOSPITAL_COMMUNITY)
Admission: EM | Admit: 2012-11-28 | Discharge: 2012-11-29 | Disposition: A | Discharge status: Home / Self Care | Payer: No Typology Code available for payment source | Attending: Emergency Medicine | Admitting: Emergency Medicine

## 2012-11-28 DIAGNOSIS — H9209 Otalgia, unspecified ear: Secondary | ICD-10-CM | POA: Insufficient documentation

## 2012-11-28 DIAGNOSIS — Z79899 Other long term (current) drug therapy: Secondary | ICD-10-CM | POA: Insufficient documentation

## 2012-11-28 DIAGNOSIS — J029 Acute pharyngitis, unspecified: Secondary | ICD-10-CM | POA: Insufficient documentation

## 2012-11-28 DIAGNOSIS — Z8619 Personal history of other infectious and parasitic diseases: Secondary | ICD-10-CM | POA: Insufficient documentation

## 2012-11-28 NOTE — ED Provider Notes (Signed)
This chart was scribed for Elpidio Anis (PA) non-physician practitioner working with Angela Chick, MD by Sofie Rower, ED Scribe. This patient was seen in room WTR9/WTR9 and the patient's care was started at 11:35PM.   History    CSN: [id number] Arrival date & time 11/28/12  2318  First MD Initiated Contact with Patient 11/28/12 2335     Chief Complaint  Patient presents with  . Sore Throat  . Otalgia   (Consider location/radiation/quality/duration/timing/severity/associated sxs/prior Treatment) The history is provided by the patient. No language interpreter was used.    Angela Peck is a 22 y.o. female , with a hx of chlamydia, BV, trichomonas vaginitis, and cyst excision from chest (Performed in 1995) , who presents to the Emergency Department complaining of sudden), progressively worsening, sore throat, located at the right side of the throat, onset two days ago (11/26/12).  Associated symptoms include otalgia, located at the right ear, and difficulty swallowing. The pt reports she has been experiencing a right sided sore throat and right sided ear pain accompanied by painful swallowing for the past two days which has prompted her concern and desire to seek medical evaluation at [facility] this evening (11/28/12). The pt has taken ibuprofen (last application yesterday, 11/27/12) which does not provide relief of the sore throat nor otalgia.   The pt denies sinus pressure, nasal congestion, cough, nausea, and vomiting.  Furthermore, the pt denies exposure to any individuals diagnosed with sore throat.   The pt does not smoke, however, she does drink alcohol.   PCP is Dr. Sampson Si.   Past Medical History  Diagnosis Date  . Chlamydia   . BV (bacterial vaginosis)   . Trichomonas vaginitis    Past Surgical History  Procedure Laterality Date  . Cyst excision  1995    from chest   Family History  Problem Relation Age of Onset  . Hypertension Mother   . Glaucoma Mother   . Cancer Maternal  Uncle   . Diabetes Maternal Grandmother   . Liver disease Maternal Grandmother   . Kidney disease Maternal Grandmother    History  Substance Use Topics  . Smoking status: Never Smoker   . Smokeless tobacco: Never Used  . Alcohol Use: 0.6 oz/week    1 Shots of liquor per week   OB History   Grav Para Term Preterm Abortions TAB SAB Ect Mult Living                 Review of Systems  HENT: Positive for ear pain and sore throat. Negative for congestion and sinus pressure.   Respiratory: Negative for cough.   Gastrointestinal: Negative for nausea and vomiting.  All other systems reviewed and are negative.    Allergies  Review of patient's allergies indicates no known allergies.  Home Medications   Current Outpatient Rx  Name  Route  Sig  Dispense  Refill  . Biotin 10 MG CAPS   Oral   Take 1 capsule by mouth daily.         . ferrous sulfate 325 (65 FE) MG tablet   Oral   Take 325 mg by mouth daily with breakfast.         . hyoscyamine (LEVSIN/SL) 0.125 MG SL tablet   Sublingual   Place 1 tablet (0.125 mg total) under the tongue every 6 (six) hours as needed for cramping.   60 tablet   2   . metroNIDAZOLE (METROGEL) 0.75 % vaginal gel   Vaginal  Place 1 Applicatorful vaginally 2 (two) times daily.   70 g   0    BP 121/69  Pulse 81  Temp(Src) 98.4 F (36.9 C) (Oral)  Resp 16  SpO2 100% Physical Exam  Nursing note and vitals reviewed. Constitutional: She is oriented to person, place, and time. She appears well-developed and well-nourished. No distress.  HENT:  Head: Normocephalic and atraumatic.  Right Ear: Tympanic membrane and external ear normal.  Left Ear: Tympanic membrane and external ear normal.  Nose: Nose normal.  Mouth/Throat: Oropharynx is clear and moist.  Right tonsillar erythema and swelling uvulaa midline, no evidence of peritonsillar abscess. Aphthous ulcer located at the anterior buccal surface.   Eyes: EOM are normal.  Neck: Neck  supple. No tracheal deviation present.  Cardiovascular: Normal rate, regular rhythm and normal heart sounds.  Exam reveals no gallop and no friction rub.   No murmur heard. Pulmonary/Chest: Effort normal and breath sounds normal. No respiratory distress. She has no wheezes.  Abdominal: Soft. Bowel sounds are normal. She exhibits no distension. There is no tenderness.  Musculoskeletal: Normal range of motion.  Neurological: She is alert and oriented to person, place, and time.  Skin: Skin is warm and dry.  Psychiatric: She has a normal mood and affect. Her behavior is normal.    ED Course  Procedures (including critical care time)  DIAGNOSTIC STUDIES: Oxygen Saturation is 100% on room air, normal by my interpretation.    COORDINATION OF CARE:  11:56 PM- Treatment plan discussed with patient. Pt agrees with treatment.     Labs Reviewed  RAPID STREP SCREEN   Results for orders placed during the hospital encounter of 11/28/12  RAPID STREP SCREEN      Result Value Range   Streptococcus, Group A Screen (Direct) NEGATIVE  NEGATIVE    No results found. No diagnosis found. 1. Pharyngitis  MDM  Strep negative. She has concern for recent oral sex in asymptomatic partner. No purulence, unilateral symptoms make GC pharyngitis unlikely. Consider early peritonsillar abscess given unilateral symptoms of significant soreness. Will treat with Zithromax and supportive care. Encouraged return with any worsening symptoms.   I personally performed the services described in this documentation, which was scribed in my presence. The recorded information has been reviewed and is accurate.     Arnoldo Hooker, PA-C 11/29/12 0023

## 2012-11-28 NOTE — ED Notes (Signed)
Pt c/o sore throat for the past 2 days, worse on the R side of her throat. Pt states now she has an earache to her R ear. Pt denies any other symptoms. Pt denies difficulty swallowing. Pt with no acute distress. Pt ambulatory to exam room with steady gait. Pt states she drove herself here.

## 2012-11-29 MED ORDER — AZITHROMYCIN 250 MG PO TABS: 250.0000 mg | ORAL_TABLET | Freq: Every day | ORAL | Status: DC

## 2012-11-29 MED ORDER — IBUPROFEN 800 MG PO TABS
800.0000 mg | ORAL_TABLET | Freq: Once | ORAL | Status: AC
  Administered 2012-11-29: 800 mg via ORAL
  Filled 2012-11-29: qty 1

## 2012-11-29 MED ORDER — HYDROCODONE-ACETAMINOPHEN 7.5-325 MG/15ML PO SOLN: 10.0000 mL | Freq: Four times a day (QID) | ORAL | Status: DC | PRN

## 2012-11-29 MED ORDER — IBUPROFEN 800 MG PO TABS: 800.0000 mg | ORAL_TABLET | Freq: Three times a day (TID) | ORAL | Status: DC

## 2012-11-29 NOTE — ED Provider Notes (Signed)
Medical screening examination/treatment/procedure(s) were performed by non-physician practitioner and as supervising physician I was immediately available for consultation/collaboration.  [name], MD 11/29/12 [phone]

## 2012-11-30 LAB — CULTURE, GROUP A STREP

## 2013-05-12 ENCOUNTER — Encounter (HOSPITAL_COMMUNITY): Payer: Self-pay | Admitting: Emergency Medicine

## 2013-05-12 ENCOUNTER — Emergency Department (INDEPENDENT_AMBULATORY_CARE_PROVIDER_SITE_OTHER)
Admission: EM | Admit: 2013-05-12 | Discharge: 2013-05-12 | Disposition: A | Discharge status: Home / Self Care | Payer: No Typology Code available for payment source | Source: Home / Self Care | Attending: Family Medicine | Admitting: Family Medicine

## 2013-05-12 DIAGNOSIS — J069 Acute upper respiratory infection, unspecified: Secondary | ICD-10-CM

## 2013-05-12 MED ORDER — FIRST-DUKES MOUTHWASH MT SUSP: 10.0000 mL | Freq: Four times a day (QID) | OROMUCOSAL | Status: DC | PRN

## 2013-05-12 MED ORDER — IPRATROPIUM BROMIDE 0.06 % NA SOLN: 2.0000 | Freq: Four times a day (QID) | NASAL | Status: DC

## 2013-05-12 NOTE — ED Provider Notes (Signed)
CSN: [id number]     Arrival date & time 05/12/13  1520 History   First MD Initiated Contact with Patient 05/12/13 1532     Chief Complaint  Patient presents with  . Sore Throat   (Consider location/radiation/quality/duration/timing/severity/associated sxs/prior Treatment) Patient is a 22 y.o. female presenting with pharyngitis. The history is provided by the patient.  Sore Throat This is a new problem. The current episode started more than 1 week ago. The problem has not changed since onset.Pertinent negatives include no chest pain, no abdominal pain, no headaches and no shortness of breath. The symptoms are aggravated by swallowing.    Past Medical History  Diagnosis Date  . Chlamydia   . BV (bacterial vaginosis)   . Trichomonas vaginitis    Past Surgical History  Procedure Laterality Date  . Cyst excision  1995    from chest   Family History  Problem Relation Age of Onset  . Hypertension Mother   . Glaucoma Mother   . Cancer Maternal Uncle   . Diabetes Maternal Grandmother   . Liver disease Maternal Grandmother   . Kidney disease Maternal Grandmother    History  Substance Use Topics  . Smoking status: Never Smoker   . Smokeless tobacco: Never Used  . Alcohol Use: 0.6 oz/week    1 Shots of liquor per week   OB History   Grav Para Term Preterm Abortions TAB SAB Ect Mult Living                 Review of Systems  Constitutional: Negative.  Negative for fever and chills.  HENT: Positive for congestion and rhinorrhea.   Respiratory: Negative for shortness of breath.   Cardiovascular: Negative for chest pain.  Gastrointestinal: Negative.  Negative for abdominal pain.  Neurological: Negative for headaches.    Allergies  Review of patient's allergies indicates no known allergies.  Home Medications   Current Outpatient Rx  Name  Route  Sig  Dispense  Refill  . azithromycin (ZITHROMAX) 250 MG tablet   Oral   Take 1 tablet (250 mg total) by mouth daily. Take first  2 tablets together, then 1 every day until finished.   6 tablet   0   . Biotin 10 MG CAPS   Oral   Take 1 capsule by mouth daily.         . cetirizine (ZYRTEC) 10 MG tablet   Oral   Take 10 mg by mouth daily.         . diphenhydrAMINE (BENADRYL) 25 MG tablet   Oral   Take 25 mg by mouth every 6 (six) hours as needed for allergies.         . ferrous sulfate 325 (65 FE) MG tablet   Oral   Take 325 mg by mouth daily with breakfast.         . HYDROcodone-acetaminophen (HYCET) 7.5-325 mg/15 ml solution   Oral   Take 10 mLs by mouth 4 (four) times daily as needed for pain.   75 mL   0   . ibuprofen (ADVIL,MOTRIN) 200 MG tablet   Oral   Take 600 mg by mouth every 6 (six) hours as needed for pain.         Marland Kitchen ibuprofen (ADVIL,MOTRIN) 800 MG tablet   Oral   Take 1 tablet (800 mg total) by mouth 3 (three) times daily.   21 tablet   0    BP 121/83  Pulse 90  Temp(Src) 98.5 F (  36.9 C) (Oral)  Resp 14  SpO2 100%  LMP 05/05/2013 Physical Exam  Nursing note and vitals reviewed. Constitutional: She is oriented to person, place, and time. She appears well-developed and well-nourished.  HENT:  Head: Normocephalic.  Right Ear: External ear normal.  Left Ear: External ear normal.  Mouth/Throat: Oropharynx is clear and moist.  Eyes: Conjunctivae are normal. Pupils are equal, round, and reactive to light.  Neck: Normal range of motion. Neck supple.  Cardiovascular: Normal rate, regular rhythm and normal heart sounds.   Pulmonary/Chest: Breath sounds normal.  Lymphadenopathy:    She has no cervical adenopathy.  Neurological: She is alert and oriented to person, place, and time.  Skin: Skin is warm and dry.    ED Course  Procedures (including critical care time) Labs Review Labs Reviewed - No data to display Imaging Review No results found.  EKG Interpretation    Date/Time:    Ventricular Rate:    PR Interval:    QRS Duration:   QT Interval:    QTC  Calculation:   R Axis:     Text Interpretation:              MDM      Linna Hoff, MD 05/12/13 1601

## 2013-05-12 NOTE — ED Notes (Signed)
C/o sore throat for two weeks States throat is throbbing with pain States it is hard to swallow States throat pain is radiating to bilateral ear

## 2013-05-14 LAB — CULTURE, GROUP A STREP

## 2014-02-05 ENCOUNTER — Encounter (HOSPITAL_COMMUNITY): Payer: Self-pay | Admitting: Emergency Medicine

## 2014-02-05 ENCOUNTER — Emergency Department (HOSPITAL_COMMUNITY)
Admission: EM | Admit: 2014-02-05 | Discharge: 2014-02-05 | Discharge status: Against Medical Advice | Payer: BC Managed Care – PPO | Attending: Emergency Medicine | Admitting: Emergency Medicine

## 2014-02-05 ENCOUNTER — Emergency Department (HOSPITAL_COMMUNITY)
Admission: EM | Admit: 2014-02-05 | Discharge: 2014-02-06 | Disposition: A | Discharge status: Home / Self Care | Payer: BC Managed Care – PPO | Attending: Emergency Medicine | Admitting: Emergency Medicine

## 2014-02-05 DIAGNOSIS — Z3202 Encounter for pregnancy test, result negative: Secondary | ICD-10-CM | POA: Insufficient documentation

## 2014-02-05 DIAGNOSIS — Z8619 Personal history of other infectious and parasitic diseases: Secondary | ICD-10-CM | POA: Diagnosis not present

## 2014-02-05 DIAGNOSIS — R1032 Left lower quadrant pain: Secondary | ICD-10-CM | POA: Insufficient documentation

## 2014-02-05 DIAGNOSIS — R109 Unspecified abdominal pain: Secondary | ICD-10-CM | POA: Diagnosis present

## 2014-02-05 DIAGNOSIS — N898 Other specified noninflammatory disorders of vagina: Secondary | ICD-10-CM | POA: Insufficient documentation

## 2014-02-05 NOTE — ED Notes (Signed)
Pt complains of left sided abd pain for two days, no vomiting but states she's had some diarrhea

## 2014-02-05 NOTE — ED Notes (Signed)
Pt in c/o left suprapubic abd pain and vaginal discharge since yesterday, denies n/v, no distress noted

## 2014-02-06 LAB — WET PREP, GENITAL
Clue Cells Wet Prep HPF POC: NONE SEEN
Trich, Wet Prep: NONE SEEN
Yeast Wet Prep HPF POC: NONE SEEN

## 2014-02-06 LAB — URINALYSIS, ROUTINE W REFLEX MICROSCOPIC
Bilirubin Urine: NEGATIVE
Glucose, UA: NEGATIVE mg/dL
Hgb urine dipstick: NEGATIVE
Ketones, ur: NEGATIVE mg/dL
Leukocytes, UA: NEGATIVE
Nitrite: NEGATIVE
Protein, ur: NEGATIVE mg/dL
Specific Gravity, Urine: 1.024 (ref 1.005–1.030)
Urobilinogen, UA: 0.2 mg/dL (ref 0.0–1.0)
pH: 5.5 (ref 5.0–8.0)

## 2014-02-06 LAB — POC URINE PREG, ED: Preg Test, Ur: NEGATIVE

## 2014-02-06 LAB — GC/CHLAMYDIA PROBE AMP
CT Probe RNA: NEGATIVE
GC Probe RNA: NEGATIVE

## 2014-02-06 LAB — HIV ANTIBODY (ROUTINE TESTING W REFLEX): HIV 1&2 Ab, 4th Generation: NONREACTIVE

## 2014-02-06 NOTE — Discharge Instructions (Signed)
Abdominal Pain, Women °Abdominal (stomach, pelvic, or belly) pain can be caused by many things. It is important to tell your doctor: °· The location of the pain. °· Does it come and go or is it present all the time? °· Are there things that start the pain (eating certain foods, exercise)? °· Are there other symptoms associated with the pain (fever, nausea, vomiting, diarrhea)? °All of this is helpful to know when trying to find the cause of the pain. °CAUSES  °· Stomach: virus or bacteria infection, or ulcer. °· Intestine: appendicitis (inflamed appendix), regional ileitis (Crohn's disease), ulcerative colitis (inflamed colon), irritable bowel syndrome, diverticulitis (inflamed diverticulum of the colon), or cancer of the stomach or intestine. °· Gallbladder disease or stones in the gallbladder. °· Kidney disease, kidney stones, or infection. °· Pancreas infection or cancer. °· Fibromyalgia (pain disorder). °· Diseases of the female organs: °¨ Uterus: fibroid (non-cancerous) tumors or infection. °¨ Fallopian tubes: infection or tubal pregnancy. °¨ Ovary: cysts or tumors. °¨ Pelvic adhesions (scar tissue). °¨ Endometriosis (uterus lining tissue growing in the pelvis and on the pelvic organs). °¨ Pelvic congestion syndrome (female organs filling up with blood just before the menstrual period). °¨ Pain with the menstrual period. °¨ Pain with ovulation (producing an egg). °¨ Pain with an IUD (intrauterine device, birth control) in the uterus. °¨ Cancer of the female organs. °· Functional pain (pain not caused by a disease, may improve without treatment). °· Psychological pain. °· Depression. °DIAGNOSIS  °Your doctor will decide the seriousness of your pain by doing an examination. °· Blood tests. °· X-rays. °· Ultrasound. °· CT scan (computed tomography, special type of X-ray). °· MRI (magnetic resonance imaging). °· Cultures, for infection. °· Barium enema (dye inserted in the large intestine, to better view it with  X-rays). °· Colonoscopy (looking in intestine with a lighted tube). °· Laparoscopy (minor surgery, looking in abdomen with a lighted tube). °· Major abdominal exploratory surgery (looking in abdomen with a large incision). °TREATMENT  °The treatment will depend on the cause of the pain.  °· Many cases can be observed and treated at home. °· Over-the-counter medicines recommended by your caregiver. °· Prescription medicine. °· Antibiotics, for infection. °· Birth control pills, for painful periods or for ovulation pain. °· Hormone treatment, for endometriosis. °· Nerve blocking injections. °· Physical therapy. °· Antidepressants. °· Counseling with a psychologist or psychiatrist. °· Minor or major surgery. °HOME CARE INSTRUCTIONS  °· Do not take laxatives, unless directed by your caregiver. °· Take over-the-counter pain medicine only if ordered by your caregiver. Do not take aspirin because it can cause an upset stomach or bleeding. °· Try a clear liquid diet (broth or water) as ordered by your caregiver. Slowly move to a bland diet, as tolerated, if the pain is related to the stomach or intestine. °· Have a thermometer and take your temperature several times a day, and record it. °· Bed rest and sleep, if it helps the pain. °· Avoid sexual intercourse, if it causes pain. °· Avoid stressful situations. °· Keep your follow-up appointments and tests, as your caregiver orders. °· If the pain does not go away with medicine or surgery, you may try: °¨ Acupuncture. °¨ Relaxation exercises (yoga, meditation). °¨ Group therapy. °¨ Counseling. °SEEK MEDICAL CARE IF:  °· You notice certain foods cause stomach pain. °· Your home care treatment is not helping your pain. °· You need stronger pain medicine. °· You want your IUD removed. °· You feel faint or   lightheaded. °· You develop nausea and vomiting. °· You develop a rash. °· You are having side effects or an allergy to your medicine. °SEEK IMMEDIATE MEDICAL CARE IF:  °· Your  pain does not go away or gets worse. °· You have a fever. °· Your pain is felt only in portions of the abdomen. The right side could possibly be appendicitis. The left lower portion of the abdomen could be colitis or diverticulitis. °· You are passing blood in your stools (bright red or black tarry stools, with or without vomiting). °· You have blood in your urine. °· You develop chills, with or without a fever. °· You pass out. °MAKE SURE YOU:  °· Understand these instructions. °· Will watch your condition. °· Will get help right away if you are not doing well or get worse. °Document Released: 03/19/2007 Document Revised: 10/06/2013 Document Reviewed: 04/08/2009 °ExitCare® Patient Information ©2015 [employer]. This information is not intended to replace advice given to you by your health care provider. Make sure you discuss any questions you have with your health care provider. ° °Emergency Department Resource Guide °1) Find a Doctor and Pay Out of Pocket °Although you won't have to find out who is covered by your insurance plan, it is a good idea to ask around and get recommendations. You will then need to call the office and see if the doctor you have chosen will accept you as a new patient and what types of options they offer for patients who are self-pay. Some doctors offer discounts or will set up payment plans for their patients who do not have insurance, but you will need to ask so you aren't surprised when you get to your appointment. ° °2) Contact Your Local Health Department °Not all health departments have doctors that can see patients for sick visits, but many do, so it is worth a call to see if yours does. If you don't know where your local health department is, you can check in your phone book. The CDC also has a tool to help you locate your state's health department, and many state websites also have listings of all of their local health departments. ° °3) Find a Walk-in Clinic °If your illness is  not likely to be very severe or complicated, you may want to try a walk in clinic. These are popping up all over the country in pharmacies, drugstores, and shopping centers. They're usually staffed by nurse practitioners or physician assistants that have been trained to treat common illnesses and complaints. They're usually fairly quick and inexpensive. However, if you have serious medical issues or chronic medical problems, these are probably not your best option. ° °No Primary Care Doctor: °- Call [facility] at  832-8000 - they can help you locate a primary care doctor that  accepts your insurance, provides certain services, etc. °- Physician Referral Service- 1-800-533-3463 ° °Chronic Pain Problems: °Organization         Address  Phone   Notes  °[facility]  (336) 297-2271 Patients need to be referred by their primary care doctor.  ° °Medication Assistance: °Organization         Address  Phone   Notes  °[place] [facility] [address] °[place], [place] [address] (336) 641-8030 --Must be a resident of [place] °-- Must have NO insurance coverage whatsoever (no Medicaid/ Medicare, etc.) °-- The pt. MUST have a primary care doctor that directs their care regularly   and follows them in the community °  °[facility]  (866) 331-1348   °[employer]  (888) 892-1162   ° °Agencies that provide inexpensive medical care: °Organization         Address  Phone   Notes  °[facility]  (336) 832-8035   °[facility]    (336) 832-7272   °[facility] [address] °[place], [place] [address] (336) 832-4777   °[facility] [address], °[place] (336) 271-4999   °[facility]    (336) 373-0678   °[facility]    (336) 272-1050   °[facility] ° [address], [place] Phone:  (336) 832-4444, Fax:  (336) 832-4440 Hours of Operation:  9 am - 6  pm, M-F.  Also accepts Medicaid/Medicare and self-pay.  °[facility] ° [address], [place] Phone: (336) 832-3150, Fax: (336) [phone]. Hours of Operation:  8:30 am - 5:30 pm, M-F.  Also accepts Medicaid and self-pay.  °[facility] [place] [address], [place] Phone: (336) 878-6027   °[facility] [address], [place], [place] (336)723-1848, Ext. 123 Mondays & Thursdays: 7-9 AM.  First 15 patients are seen on a first come, first serve basis. °  ° °Medicaid-accepting [place] Providers: ° °Organization         Address  Phone   Notes  °[facility] [address], [place] (336) 641-2100 Also accepts self-pay patients.  °[facility] [address], [place] ° (336) 856-9996   °[facility] [address], [place] (336) 288-8857   °[facility] [address] [place] [address], [place] (336) 299-7000   °Veita Bland [address], [place]  ° (336) 373-1557 Only accepts [place] Access Medicaid patients after they have their name applied to their card.  ° °Self-Pay (no insurance) in [place]: ° °Organization         Address  Phone   Notes  °Sickle Cell Patients, [facility] [address], [place] (336) 832-1970   °[facility] [address], [place] (336) 832-4400   °[facility] [place] ° [address], [place] (336) 992-4800   °[facility]/Dr. Osei-Bonsu ° [address] [place] [address], [place] or [address], [place] (336) [phone] Phone number for both [place] and [place] locations is the same.  °[facility] [address], [place] (336) 299-0000   °[facility] [address] [place] [address], [place] or [address] (336) 852-7530 °(336) 878-2260   °[facility] [address], [place] (336) 350-1642, phone; (336) 294-5005, fax Sees patients 1st and 3rd Saturday of every month.  Must not qualify for public or private insurance (i.e. Medicaid, Medicare, [place] Health Choice, Veterans' Benefits) • Household income should be no more than 200% of the poverty level •The clinic cannot treat you if you are pregnant or think you are pregnant • Sexually transmitted diseases are not treated at the clinic.  ° ° °Dental Care: °Organization         Address  Phone  Notes  °[facility] [address],  (336) 641-6152 Accepts children up to age 21 who   are enrolled in Medicaid or [place] Health Choice; pregnant women with a Medicaid card; and children who have applied for Medicaid or [place] Health Choice, but were declined, whose parents can pay a reduced fee at time of service.  °[facility] [place]  [address], [place] (336) 641-7733 Accepts children up to age 21 who are enrolled in Medicaid or [place] Health Choice; pregnant women with a Medicaid card; and children who have applied for Medicaid or [place] Health Choice, but were declined, whose parents can pay a reduced fee at time of service.  °[facility] ° [address], [place] (336) 641-4533 Patients are seen by appointment only. Walk-ins are not accepted. [facility] will see patients 18 years of age and older. °Monday - Tuesday (8am-5pm) °Most Wednesdays (8:30-5pm) °$30 per visit, cash only  °[facility] ° [address], [place] (336) 641-4533 Patients are seen by appointment only. Walk-ins are not accepted. [facility] will see patients 18 years of age and older. °One Wednesday Evening (Monthly: Volunteer Based).  $30 per visit, cash only  °[facility]  (919) 537-3737 for adults; Children under age 4, call Graduate Pediatric Dentistry at ([phone])  [phone]. Children aged 4-14, please call (919) [phone] to request a pediatric application. ° Dental services are provided in all areas of dental care including fillings, crowns and bridges, complete and partial dentures, implants, gum treatment, root canals, and extractions. Preventive care is also provided. Treatment is provided to both adults and children. °Patients are selected via a lottery and there is often a waiting list. °  °[facility] [address], °[place] ° (336) 763-8833 [email or web address] °  °[facility] [address], [place], [place] (336)723-1848, Ext. 123 Second and Fourth Thursday of each month, opens at 6:30 AM; Clinic ends at 9 AM.  Patients are seen on a first-come first-served basis, and a limited number are seen during each clinic.  ° °[facility] ° [address], [place], [place] (336) 723-7904   Eligibility Requirements °You must have lived in [place], [place], or [place] counties for at least the last three months. °  You cannot be eligible for state or federal sponsored healthcare insurance, including Veterans Administration, Medicaid, or Medicare. °  You generally cannot be eligible for healthcare insurance through your employer.  °  How to apply: °Eligibility screenings are held every Tuesday and Wednesday afternoon from 1:00 pm until 4:00 pm. You do not need an appointment for the interview!  °[facility] [address], [place], [place] 336-631-2330   °[facility]  336-342-8273   °[facility]  336-703-3100   °[facility]  336-570-6415   ° °Behavioral Health Resources in the Community: °Intensive Outpatient Programs °Organization         Address  Phone  Notes  °[facility] [address], [place], [place] 336-878-6098   °[facility] [address], [place], [place] 336-832-9800   °[facility]  [address], [place], [place] ° 336-882-2125   °[facility] [address],  °, [place] 1-800-853-5163 or 336-641-4981   °Substance Abuse Resources °Organization         Address  Phone  Notes  °[facility]  336-882-2125   °[facility]  336-784-9470   °  [facility]  336-285-9073   °[facility]  336-845-3988   °Residential & Outpatient Substance Abuse Program  1-800-659-3381   °Psychological Services °Organization         Address  Phone  Notes  °[facility]  336- 832-9600   °[facility]  336- 378-7881   °[facility] [address], [place] 1-800-853-5163 or 336-641-4981   ° °Mobile Crisis Teams °Organization         Address  Phone  Notes  °[facility], Mobile Crisis Care Unit  1-877-626-1772   °Assertive °[facility] ° [address] [place], [place] 336-834-9664   °Sharon DeEsch [address] °[place] 336-554-5454   ° °Self-Help/Support Groups °Organization         Address  Phone             Notes  °[facility] - variety of support groups  336- 373-1402 Call for more information  °Narcotics Anonymous (NA), [facility] [address], °[place]  2 meetings at this location  ° °Residential Treatment Programs °Organization         Address  Phone  Notes  °[facility] [address],    °[place]  1-866-801-8205   °[facility] ° [address], [place], [place] 704-293-8524   °[facility] [address], [place] 336-845-3988 Admissions: 8am-3pm M-F  °[facility] [address]    °[place], [place] 336-841-1104   °[facility] [address], [place], [place] 336-379-7146   °[facility] [address]  °[place], [place] 336-285-9073   °[facility] - Intensive Outpatient [address], [place], [place]  336-852-3033   °[facility] [address]  °[place], [place] 1-877-615-2722 or 336-784-9470   °[facility] [address] [place], [place] 336-227-7417 Accepts Medicaid  °[facility] [address]  °[place] 1-800-659-3381 Substance Abuse/Addiction Treatment  ° °[place] Behavioral Health Resources °Organization         Address  Phone  Notes  °[facility]  (888) 581-9988   °Julie Brannon, PhD [address] [place], [place]   (336) 349-5553 or (336) 951-0000   ° Behavioral   [address] °[place], [place] (336) 349-4454   °[facility] [address], [place], [place] (336) 342-8316 Insurance/Medicaid/sponsorship through [facility]  °[facility] [address]                                    [place], [place] (336) 342-8316 Therapy/tele-psych/case  °[facility] [address]  ° [place], [place] (336) 349-2233    °Dr. Arfeen  (336) 349-4544   °[facility] [place]  [employer] [place] [facility] 1) [address],  °2) [address], [place] °3)  [address], [place] (336) 349-3220 °(336) 342-7768 ° °(336) 342-8140   °[place] Child Abuse Hotline (336) 342-1394 or (336) 342-3537 (After Hours)    ° ° °

## 2014-02-14 NOTE — ED Provider Notes (Signed)
CSN: [id number]     Arrival date & time 02/05/14  2240 History   First MD Initiated Contact with Patient 02/06/14 0044     Chief Complaint  Patient presents with  . Abdominal Pain     (Consider location/radiation/quality/duration/timing/severity/associated sxs/prior Treatment) Patient is a 23 y.o. female presenting with abdominal pain. The history is provided by the patient.  Abdominal Pain Pain location:  LLQ and suprapubic Pain quality: aching, cramping and pressure   Pain quality: not sharp   Pain radiates to:  Does not radiate Pain severity:  Moderate Onset quality:  Gradual Duration:  2 days Timing:  Constant Progression:  Unchanged Chronicity:  New Context: not alcohol use, not diet changes, not eating, not recent travel, not suspicious food intake and not trauma   Worsened by:  Movement Associated symptoms: vaginal discharge   Associated symptoms: no anorexia, no chest pain, no chills, no diarrhea, no dysuria, no fever, no hematuria, no nausea, no shortness of breath, no vaginal bleeding and no vomiting   Risk factors: no alcohol abuse, not elderly and not pregnant       Past Medical History  Diagnosis Date  . Chlamydia   . BV (bacterial vaginosis)   . Trichomonas vaginitis    Past Surgical History  Procedure Laterality Date  . Cyst excision  1995    from chest   Family History  Problem Relation Age of Onset  . Hypertension Mother   . Glaucoma Mother   . Cancer Maternal Uncle   . Diabetes Maternal Grandmother   . Liver disease Maternal Grandmother   . Kidney disease Maternal Grandmother    History  Substance Use Topics  . Smoking status: Never Smoker   . Smokeless tobacco: Never Used  . Alcohol Use: 0.6 oz/week    1 Shots of liquor per week   OB History   Grav Para Term Preterm Abortions TAB SAB Ect Mult Living                 Review of Systems  Constitutional: Negative for fever and chills.  Respiratory: Negative for shortness of breath.    Cardiovascular: Negative for chest pain.  Gastrointestinal: Positive for abdominal pain. Negative for nausea, vomiting, diarrhea and anorexia.  Genitourinary: Positive for vaginal discharge. Negative for dysuria, hematuria and vaginal bleeding.    All systems reviewed and negative, other than as noted in HPI.   Allergies  Review of patient's allergies indicates no known allergies.  Home Medications   Prior to Admission medications   Not on File   BP 113/65  Pulse 85  Temp(Src) 97.7 F (36.5 C) (Oral)  Resp 16  SpO2 99%  LMP 01/27/2014 Physical Exam  Nursing note and vitals reviewed. Constitutional: She appears well-developed and well-nourished. No distress.  HENT:  Head: Normocephalic and atraumatic.  Eyes: Conjunctivae are normal. Right eye exhibits no discharge. Left eye exhibits no discharge.  Neck: Neck supple.  Cardiovascular: Normal rate, regular rhythm and normal heart sounds.  Exam reveals no gallop and no friction rub.   No murmur heard. Pulmonary/Chest: Effort normal and breath sounds normal. No respiratory distress.  Abdominal: Soft. She exhibits no distension. There is no tenderness.  Genitourinary:  Chaperone present. Normal external female genitalia. Mild-moderate whitish vaginal discharge. No CMT. No significant adnexal mass or tenderness appreciated.   Musculoskeletal: She exhibits no edema and no tenderness.  Neurological: She is alert.  Skin: Skin is warm and dry.  Psychiatric: She has a normal mood and affect.  Her behavior is normal. Thought content normal.    ED Course  Procedures (including critical care time) Labs Review Labs Reviewed  WET PREP, GENITAL - Abnormal; Notable for the following:    WBC, Wet Prep HPF POC FEW (*)    All other components within normal limits  GC/CHLAMYDIA PROBE AMP  URINALYSIS, ROUTINE W REFLEX MICROSCOPIC  HIV ANTIBODY (ROUTINE TESTING)  POC URINE PREG, ED    Imaging Review No results found.   EKG  Interpretation None      MDM   Final diagnoses:  LLQ pain   23yF with suprapubic/LLQ pain. Abdominal exam benign. Pelvic non concerning.     Raeford Razor, MD 02/14/14 [phone]

## 2014-12-11 IMAGING — US US PELVIS COMPLETE
1 series · 14 of 25 positions shown · non-contrast
Comparison: None.

CLINICAL DATA: Left lower quadrant pain.  LMP 06/11/2012.



[Series 1: us pelvis complete · 14 of 43 slices shown]
[im 1/43]
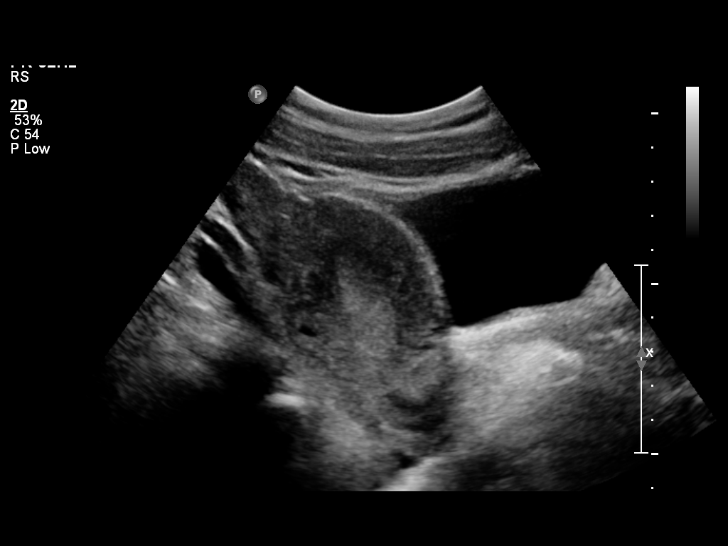
[im 4/43]
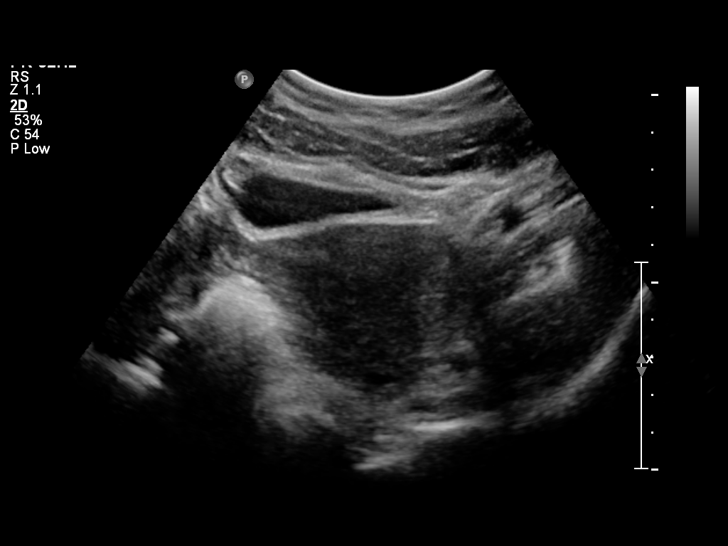
[im 8/43]
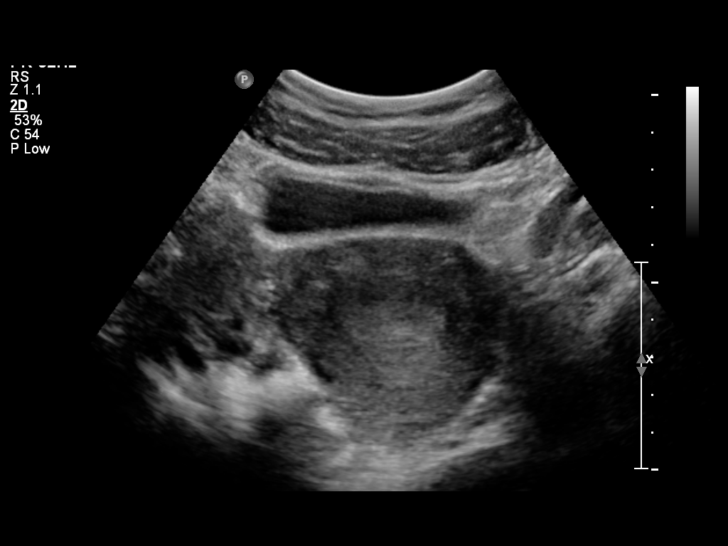
[im 11/43]
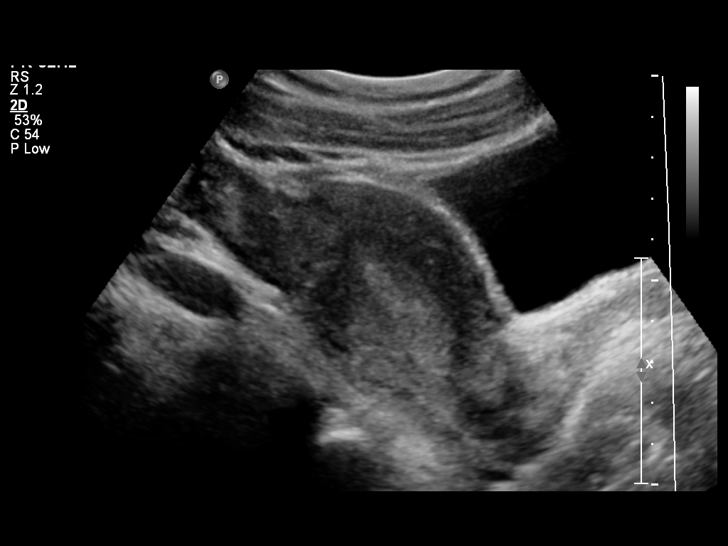
[im 15/43]
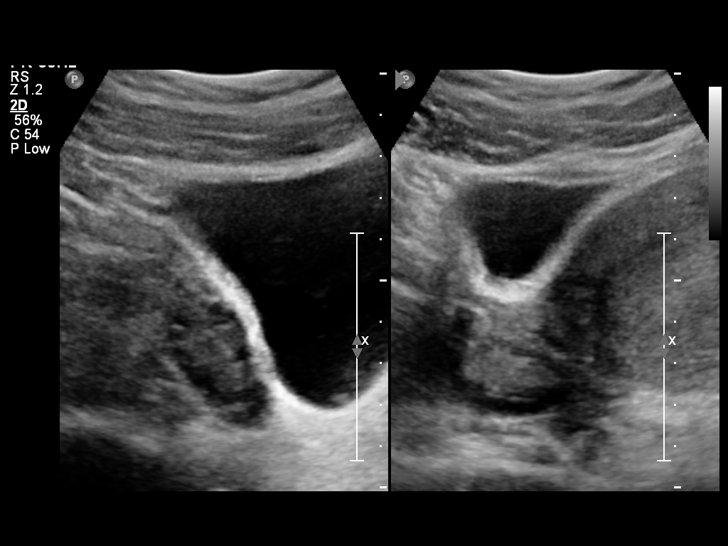
[im 16/43]
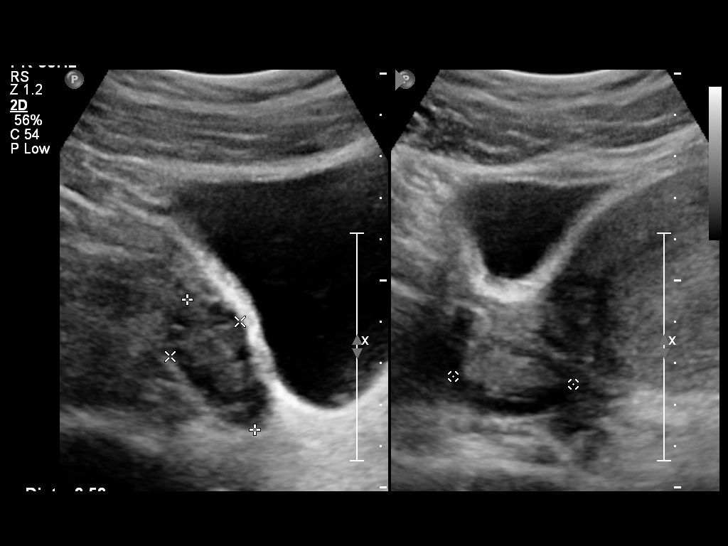
[im 20/43]
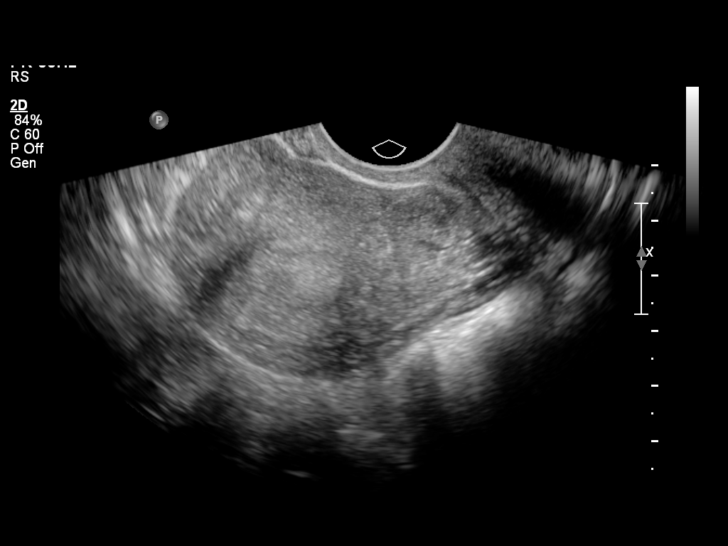
[im 23/43]
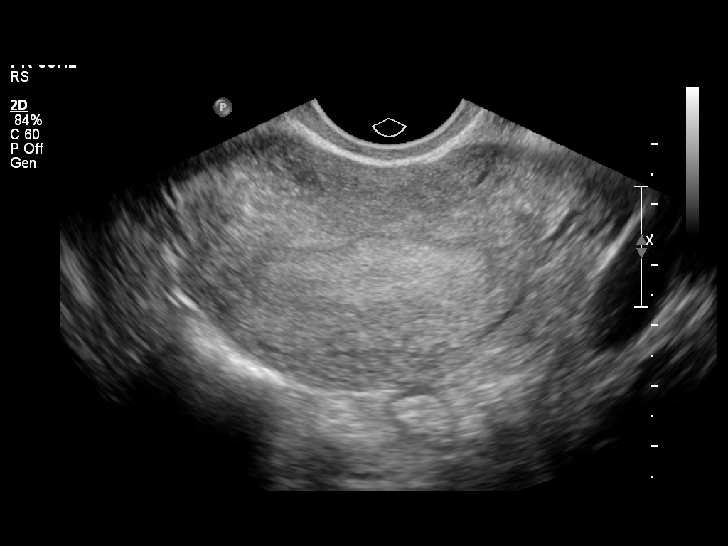
[im 27/43]
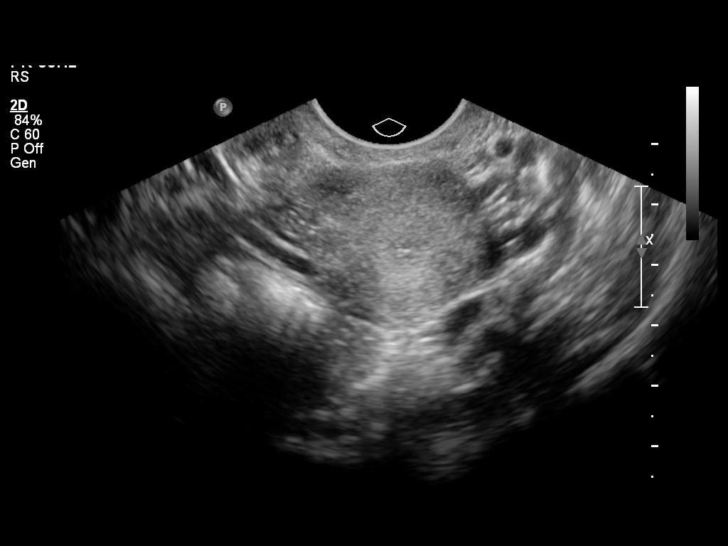
[im 29/43]
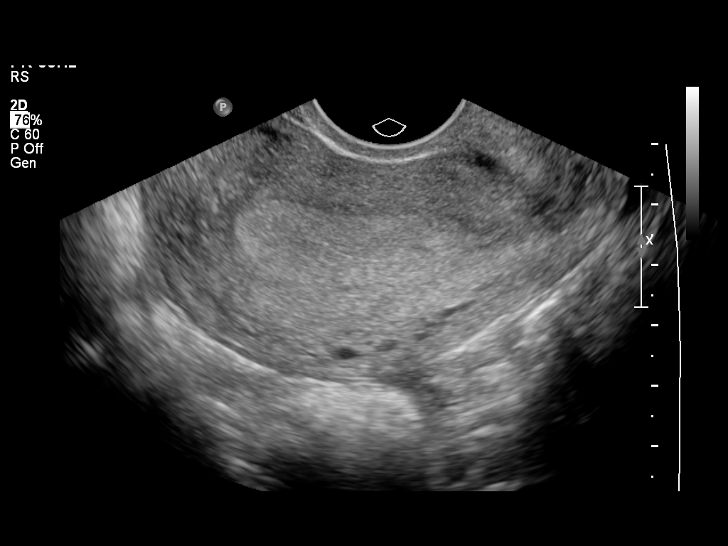
[im 32/43]
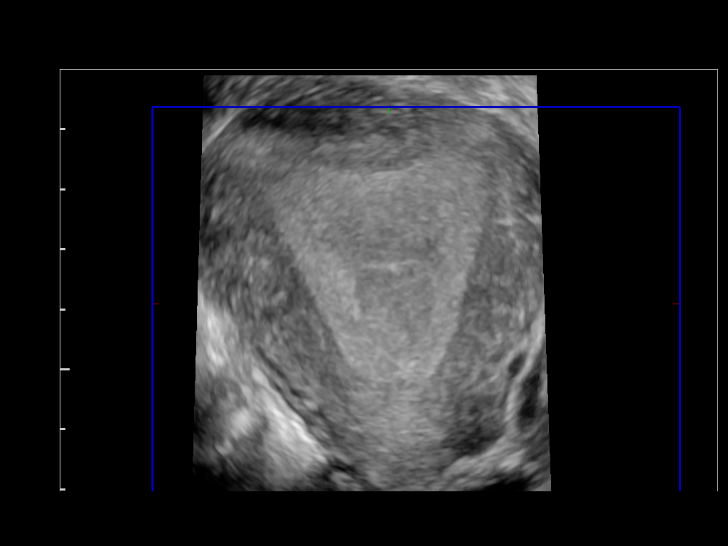
[im 36/43]
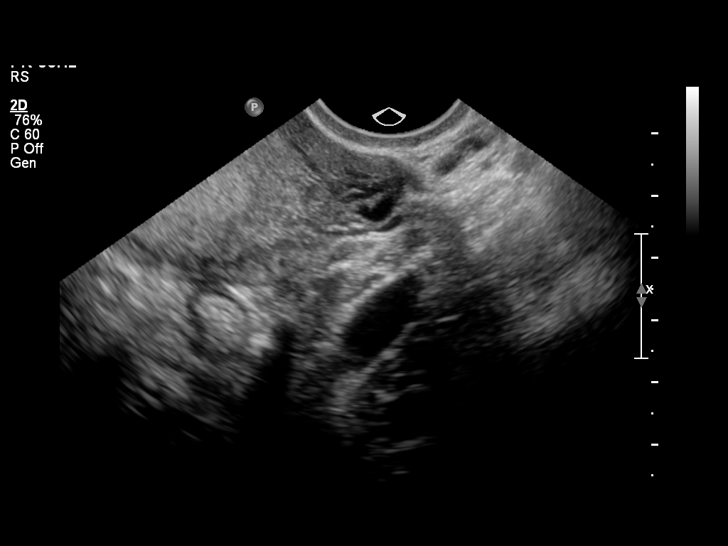
[im 39/43]
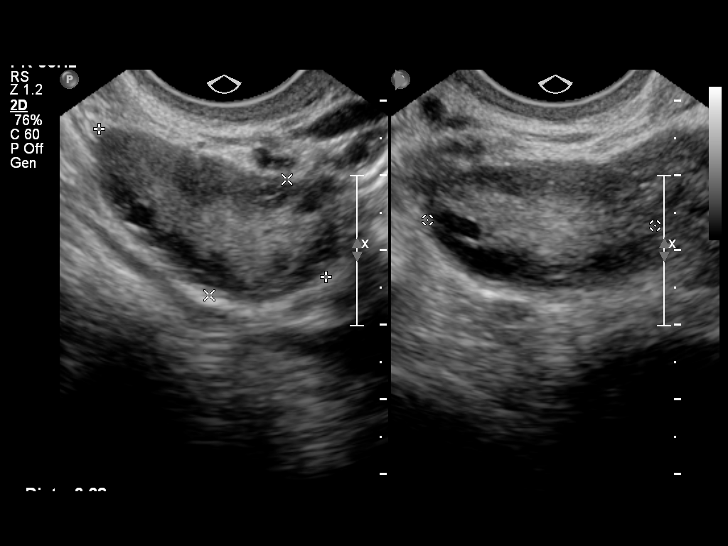
[im 43/43]
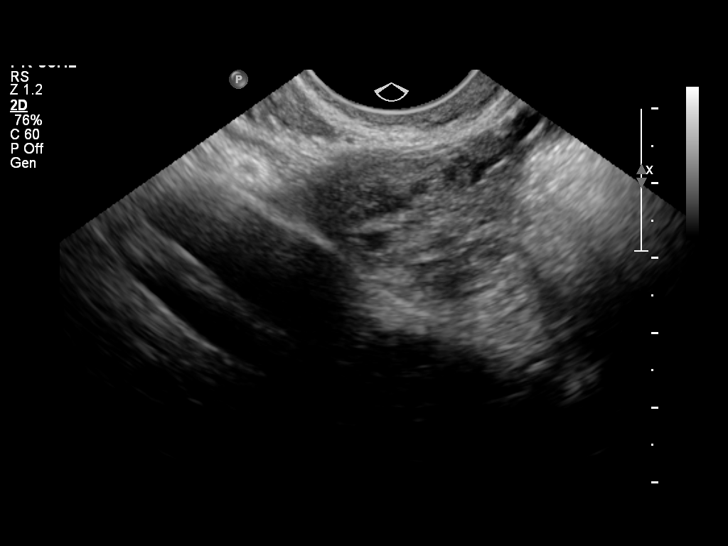

[14 of 25 positions shown; findings below may reference images not displayed]

FINDINGS: Uterus:  8.0 x 4.3 x 5.8 cm.  No fibroids or other uterine mass
identified.

Endometrium: Double layer thickness measures 13 mm transvaginally.
No focal lesion visualized.

Right ovary: 3.6 x 1.9 x 3.1 cm.  Normal appearance.  No adnexal
mass identified.

Left ovary: 4.3 x 1.2 x 1.6 cm.  Normal appearance.  No adnexal
mass identified.

Other Findings:  Pound mild above the
IMPRESSION: Normal study.  No evidence of pelvic mass or other significant
abnormality.

## 2018-12-13 HISTORY — PX: DILATION AND CURETTAGE OF UTERUS: SHX78

## 2018-12-23 ENCOUNTER — Emergency Department (HOSPITAL_COMMUNITY): Payer: BC Managed Care – PPO

## 2018-12-23 ENCOUNTER — Other Ambulatory Visit: Payer: Self-pay

## 2018-12-23 ENCOUNTER — Encounter (HOSPITAL_COMMUNITY): Payer: Self-pay | Admitting: Emergency Medicine

## 2018-12-23 ENCOUNTER — Inpatient Hospital Stay (HOSPITAL_COMMUNITY)
Admission: EM | Admit: 2018-12-23 | Discharge: 2018-12-26 | DRG: 759 | Disposition: A | Discharge status: Home / Self Care | Payer: BC Managed Care – PPO | Attending: Obstetrics & Gynecology | Admitting: Obstetrics & Gynecology

## 2018-12-23 DIAGNOSIS — Z1159 Encounter for screening for other viral diseases: Secondary | ICD-10-CM | POA: Diagnosis not present

## 2018-12-23 DIAGNOSIS — N730 Acute parametritis and pelvic cellulitis: Secondary | ICD-10-CM | POA: Diagnosis present

## 2018-12-23 DIAGNOSIS — N719 Inflammatory disease of uterus, unspecified: Secondary | ICD-10-CM | POA: Diagnosis present

## 2018-12-23 DIAGNOSIS — N7091 Salpingitis, unspecified: Secondary | ICD-10-CM | POA: Diagnosis not present

## 2018-12-23 HISTORY — DX: Acute parametritis and pelvic cellulitis: N73.0

## 2018-12-23 LAB — WET PREP, GENITAL
Clue Cells Wet Prep HPF POC: NONE SEEN
Sperm: NONE SEEN
Yeast Wet Prep HPF POC: NONE SEEN

## 2018-12-23 LAB — COMPREHENSIVE METABOLIC PANEL
ALT: 16 U/L (ref 0–44)
AST: 11 U/L — ABNORMAL LOW (ref 15–41)
Albumin: 3.4 g/dL — ABNORMAL LOW (ref 3.5–5.0)
Alkaline Phosphatase: 71 U/L (ref 38–126)
Anion gap: 14 (ref 5–15)
BUN: 10 mg/dL (ref 6–20)
CO2: 22 mmol/L (ref 22–32)
Calcium: 9 mg/dL (ref 8.9–10.3)
Chloride: 101 mmol/L (ref 98–111)
Creatinine, Ser: 0.56 mg/dL (ref 0.44–1.00)
GFR calc Af Amer: >60 mL/min (ref >60)
GFR calc non Af Amer: >60 mL/min (ref >60)
Glucose, Bld: 112 mg/dL — ABNORMAL HIGH (ref 70–99)
Potassium: 3.8 mmol/L (ref 3.5–5.1)
Sodium: 137 mmol/L (ref 135–145)
Total Bilirubin: 0.2 mg/dL — ABNORMAL LOW (ref 0.3–1.2)
Total Protein: 6.9 g/dL (ref 6.5–8.1)

## 2018-12-23 LAB — CBC WITH DIFFERENTIAL/PLATELET
Abs Immature Granulocytes: 0.07 10*3/uL (ref 0.00–0.07)
Basophils Absolute: 0 10*3/uL (ref 0.0–0.1)
Basophils Relative: 0 %
Eosinophils Absolute: 0 10*3/uL (ref 0.0–0.5)
Eosinophils Relative: 0 %
HCT: 27.4 % — ABNORMAL LOW (ref 36.0–46.0)
Hemoglobin: 8.6 g/dL — ABNORMAL LOW (ref 12.0–15.0)
Immature Granulocytes: 0 %
Lymphocytes Relative: 10 %
Lymphs Abs: 1.7 10*3/uL (ref 0.7–4.0)
MCH: 25.4 pg — ABNORMAL LOW (ref 26.0–34.0)
MCHC: 31.4 g/dL (ref 30.0–36.0)
MCV: 81.1 fL (ref 80.0–100.0)
Monocytes Absolute: 0.7 10*3/uL (ref 0.1–1.0)
Monocytes Relative: 5 %
Neutro Abs: 13.4 10*3/uL — ABNORMAL HIGH (ref 1.7–7.7)
Neutrophils Relative %: 85 %
Platelets: 422 10*3/uL — ABNORMAL HIGH (ref 150–400)
RBC: 3.38 MIL/uL — ABNORMAL LOW (ref 3.87–5.11)
RDW: 15.5 % (ref 11.5–15.5)
WBC: 15.8 10*3/uL — ABNORMAL HIGH (ref 4.0–10.5)
nRBC: 0 % (ref 0.0–0.2)

## 2018-12-23 LAB — URINALYSIS, ROUTINE W REFLEX MICROSCOPIC
Bilirubin Urine: NEGATIVE
Glucose, UA: NEGATIVE mg/dL
Hgb urine dipstick: NEGATIVE
Ketones, ur: 20 mg/dL — AB
Nitrite: NEGATIVE
Protein, ur: NEGATIVE mg/dL
Specific Gravity, Urine: 1.011 (ref 1.005–1.030)
pH: 6 (ref 5.0–8.0)

## 2018-12-23 LAB — HCG, QUANTITATIVE, PREGNANCY: hCG, Beta Chain, Quant, S: 130 m[IU]/mL — ABNORMAL HIGH (ref <5)

## 2018-12-23 LAB — GLUCOSE, CAPILLARY: Glucose-Capillary: 126 mg/dL — ABNORMAL HIGH (ref 70–99)

## 2018-12-23 LAB — LACTIC ACID, PLASMA: Lactic Acid, Venous: 0.7 mmol/L (ref 0.5–1.9)

## 2018-12-23 LAB — SARS CORONAVIRUS 2 BY RT PCR (HOSPITAL ORDER, PERFORMED IN ~~LOC~~ HOSPITAL LAB): SARS Coronavirus 2: NEGATIVE

## 2018-12-23 MED ORDER — OXYCODONE-ACETAMINOPHEN 5-325 MG PO TABS
1.0000 | ORAL_TABLET | ORAL | Status: DC | PRN
  Administered 2018-12-23 – 2018-12-25 (×5): 2 via ORAL
  Filled 2018-12-23 (×5): qty 2

## 2018-12-23 MED ORDER — OXYCODONE-ACETAMINOPHEN 5-325 MG PO TABS
1.0000 | ORAL_TABLET | Freq: Once | ORAL | Status: AC
  Administered 2018-12-23: 04:00:00 1 via ORAL
  Filled 2018-12-23: qty 1

## 2018-12-23 MED ORDER — SODIUM CHLORIDE 0.9 % IV SOLN
2.0000 g | Freq: Two times a day (BID) | INTRAVENOUS | Status: DC
  Administered 2018-12-23 (×2): 2 g via INTRAVENOUS
  Filled 2018-12-23 (×3): qty 2

## 2018-12-23 MED ORDER — ACETAMINOPHEN 325 MG PO TABS
650.0000 mg | ORAL_TABLET | Freq: Once | ORAL | Status: AC
  Administered 2018-12-23: 650 mg via ORAL
  Filled 2018-12-23: qty 2

## 2018-12-23 MED ORDER — ONDANSETRON HCL 4 MG PO TABS: 4.0000 mg | ORAL_TABLET | Freq: Four times a day (QID) | ORAL | Status: DC | PRN

## 2018-12-23 MED ORDER — HYDROMORPHONE HCL 1 MG/ML IJ SOLN: 0.2000 mg | INTRAMUSCULAR | Status: DC | PRN

## 2018-12-23 MED ORDER — ACETAMINOPHEN 500 MG PO TABS: 1000.0000 mg | ORAL_TABLET | Freq: Once | ORAL | Status: DC

## 2018-12-23 MED ORDER — ONDANSETRON HCL 4 MG/2ML IJ SOLN
4.0000 mg | Freq: Four times a day (QID) | INTRAMUSCULAR | Status: DC | PRN
  Administered 2018-12-24 – 2018-12-26 (×2): 4 mg via INTRAVENOUS
  Filled 2018-12-23 (×2): qty 2

## 2018-12-23 MED ORDER — OXYCODONE-ACETAMINOPHEN 5-325 MG PO TABS
1.0000 | ORAL_TABLET | Freq: Once | ORAL | Status: AC
  Administered 2018-12-23: 1 via ORAL
  Filled 2018-12-23: qty 1

## 2018-12-23 MED ORDER — SODIUM CHLORIDE 0.9% FLUSH
3.0000 mL | Freq: Once | INTRAVENOUS | Status: AC
  Administered 2018-12-23: 3 mL via INTRAVENOUS

## 2018-12-23 MED ORDER — METRONIDAZOLE IN NACL 5-0.79 MG/ML-% IV SOLN
500.0000 mg | Freq: Three times a day (TID) | INTRAVENOUS | Status: DC
  Administered 2018-12-23 – 2018-12-26 (×10): 500 mg via INTRAVENOUS
  Filled 2018-12-23 (×10): qty 100

## 2018-12-23 MED ORDER — SODIUM CHLORIDE 0.9 % IV SOLN
INTRAVENOUS | Status: DC
  Administered 2018-12-23: 05:00:00 via INTRAVENOUS

## 2018-12-23 MED ORDER — SODIUM CHLORIDE 0.9 % IV BOLUS
1000.0000 mL | Freq: Once | INTRAVENOUS | Status: AC
  Administered 2018-12-23: 04:00:00 1000 mL via INTRAVENOUS

## 2018-12-23 MED ORDER — SODIUM CHLORIDE 0.9 % IV SOLN
100.0000 mg | Freq: Two times a day (BID) | INTRAVENOUS | Status: DC
  Administered 2018-12-23 – 2018-12-26 (×7): 100 mg via INTRAVENOUS
  Filled 2018-12-23 (×9): qty 100

## 2018-12-23 MED ORDER — LACTATED RINGERS IV SOLN
INTRAVENOUS | Status: DC
  Administered 2018-12-23: 12:00:00 via INTRAVENOUS

## 2018-12-23 MED ORDER — KETOROLAC TROMETHAMINE 30 MG/ML IJ SOLN
30.0000 mg | Freq: Once | INTRAMUSCULAR | Status: AC
  Administered 2018-12-23: 30 mg via INTRAVENOUS
  Filled 2018-12-23: qty 1

## 2018-12-23 NOTE — ED Notes (Signed)
Ultrasound at bedside

## 2018-12-23 NOTE — Progress Notes (Signed)
   12/23/18 1400  Clinical Encounter Type  Visited With Patient  Visit Type Initial;Social support  Spiritual Encounters  Spiritual Needs Emotional  Stress Factors  Patient Stress Factors Major life changes;Loss of control;Exhausted   Intro visit w/ pt.  She appeared tired.  No specific support needs at this time.  Myra Gianotti resident, [phone]

## 2018-12-23 NOTE — Progress Notes (Signed)
Dr. Roselie Awkward notified of temp 102.9. Pt given ordered Percocet for pain. Will continue to monitor.

## 2018-12-23 NOTE — Progress Notes (Signed)
Patient has arrived to [facility] room 15 via [facility]. Dr. Roselie Awkward notified of patient's arrival and patient's c/o pain. Order for Percocet 1 tab p.o.

## 2018-12-23 NOTE — ED Provider Notes (Signed)
[facility]-EMERGENCY DEPT Provider Note   CSN: [id number] Arrival date & time: 12/23/18  0034     History   Chief Complaint Chief Complaint  Patient presents with   Abdominal Pain   Fever    HPI Katheren ShamsQuanitta Urwin is a 28 y.o. female.     The history is provided by the patient and medical records.  Abdominal Pain Associated symptoms: fever   Fever    28 y.o. F presenting to the ED for fever and abdominal pain.  Patient has spontaneous abortion on 12/11/18 and seen in the ED at [facility] in [place], [place].  She was given cytotec and discharged home.  She returned 2 days ago due to worsening pain and new fever, found to have retained products and endometritis.  She underwent emergent D&C.  She was discharged home with doxycycline which she has been taking as directed.  She reports she has continued to have some abdominal cramping, especially with urination.  She denies any ongoing vaginal bleeding.  States she just feels achy and very tired.  She denies cough, sore throat, or recent URI symptoms.  She has been tested for COVID recently due to concurrent strep throat, test was negative.  No new sick contacts.  Patient febrile and tachycardic  Past Medical History:  Diagnosis Date   BV (bacterial vaginosis)    Chlamydia    Trichomonas vaginitis     There are no active problems to display for this patient.   Past Surgical History:  Procedure Laterality Date   CYST EXCISION  1995   from chest   DILATION AND CURETTAGE OF UTERUS  12/13/2018     OB History    Gravida  2   Para      Term      Preterm      AB  2   Living  0     SAB      TAB      Ectopic      Multiple      Live Births               Home Medications    Prior to Admission medications   Not on File    Family History Family History  Problem Relation Age of Onset   Hypertension Mother    Glaucoma Mother    Cancer Maternal Uncle    Diabetes Maternal  Grandmother    Liver disease Maternal Grandmother    Kidney disease Maternal Grandmother     Social History Social History   Tobacco Use   Smoking status: Never Smoker   Smokeless tobacco: Never Used  Substance Use Topics   Alcohol use: Yes    Alcohol/week: 1.0 standard drinks    Types: 1 Shots of liquor per week   Drug use: No     Allergies   Patient has no known allergies.   Review of Systems Review of Systems  Constitutional: Positive for fever.  Gastrointestinal: Positive for abdominal pain.  All other systems reviewed and are negative.    Physical Exam Updated Vital Signs BP 116/70 (BP Location: Right Arm)    Pulse (!) 115    Temp (!) 100.9 F (38.3 C) (Oral)    Resp 18    Ht 5\' 2"  (1.575 m)    Wt 72.6 kg    LMP 10/23/2018    SpO2 98%    BMI 29.26 kg/m   Physical Exam Vitals signs and nursing note reviewed.  Constitutional:  Appearance: She is well-developed.  HENT:     Head: Normocephalic and atraumatic.  Eyes:     Conjunctiva/sclera: Conjunctivae normal.     Pupils: Pupils are equal, round, and reactive to light.  Neck:     Musculoskeletal: Normal range of motion.  Cardiovascular:     Rate and Rhythm: Normal rate and regular rhythm.     Heart sounds: Normal heart sounds.  Pulmonary:     Effort: Pulmonary effort is normal.     Breath sounds: Normal breath sounds.  Abdominal:     General: Bowel sounds are normal.     Palpations: Abdomen is soft.     Tenderness: There is abdominal tenderness.       Comments: Tenderness suprapubically and in right pelvic region, no tenderness at McBurney's point  Genitourinary:    Comments: Small amount of clear/yellow discharge noted, no appreciable bleeding Musculoskeletal: Normal range of motion.  Skin:    General: Skin is warm and dry.  Neurological:     Mental Status: She is alert and oriented to person, place, and time.      ED Treatments / Results  Labs (all labs ordered are listed, but  only abnormal results are displayed) Labs Reviewed  WET PREP, GENITAL - Abnormal; Notable for the following components:      Result Value   Trich, Wet Prep PRESENT (*)    WBC, Wet Prep HPF POC FEW (*)    All other components within normal limits  COMPREHENSIVE METABOLIC PANEL - Abnormal; Notable for the following components:   Glucose, Bld 112 (*)    Albumin 3.4 (*)    AST 11 (*)    Total Bilirubin 0.2 (*)    All other components within normal limits  CBC WITH DIFFERENTIAL/PLATELET - Abnormal; Notable for the following components:   WBC 15.8 (*)    RBC 3.38 (*)    Hemoglobin 8.6 (*)    HCT 27.4 (*)    MCH 25.4 (*)    Platelets 422 (*)    Neutro Abs 13.4 (*)    All other components within normal limits  URINALYSIS, ROUTINE W REFLEX MICROSCOPIC - Abnormal; Notable for the following components:   APPearance HAZY (*)    Ketones, ur 20 (*)    Leukocytes,Ua MODERATE (*)    Bacteria, UA RARE (*)    All other components within normal limits  HCG, QUANTITATIVE, PREGNANCY - Abnormal; Notable for the following components:   hCG, Beta Chain, Quant, S 130 (*)    All other components within normal limits  SARS CORONAVIRUS 2 (HOSPITAL ORDER, PERFORMED IN [facility])  URINE CULTURE  CULTURE, BLOOD (ROUTINE X 2)  CULTURE, BLOOD (ROUTINE X 2)  LACTIC ACID, PLASMA  LACTIC ACID, PLASMA  GC/CHLAMYDIA PROBE AMP ([place]) NOT AT [facility]    EKG None  Radiology Dg Chest 2 View  Result Date: 12/23/2018 CLINICAL DATA:  Fever. EXAM: CHEST - 2 VIEW COMPARISON:  None. FINDINGS: The cardiomediastinal contours are normal. The lungs are clear. Pulmonary vasculature is normal. No consolidation, pleural effusion, or pneumothorax. No acute osseous abnormalities are seen. Right arm is raised and both PA and lateral views, presumably positional. IMPRESSION: No acute chest findings. Electronically Signed   By: Narda RutherfordMelanie  Sanford M.D.   On: 12/23/2018 01:56   [place] Ob Comp Less 14 Wks  Result  Date: 12/23/2018 CLINICAL DATA:  28 year old female with positive HCG presenting with pelvic pain. D&C 7 days ago. EXAM: OBSTETRIC <14 WK [place]  AND TRANSVAGINAL OB US TECHNIQUE: Both transabdominal and transvaginal ultrasound examinations were performed for complete evaluation of the gestation as well as the maternal uterus, adnexal regions, and pelvic cul-de-sac. Transvaginal technique was performed to assess early pregnancy. COMPARISON:  None. FINDINGS: The uterus is anteverted. There is a 3.2 x 2.6 x 2.6 cm posterior body subserosal fibroid and a 1.5 x 1.3 x 1.4 cm posterior body intramural fibroid. The endometrium is unremarkable and measures approximately 12 mm in thickness. No intrauterine pregnancy identified. The right ovary measures 3.6 x 2.4 x 2.6 cm. Flow is noted to the right ovary. There is a 8.0 x 3.6 x 4.2 cm complex and heterogeneous mass adjacent to the right ovary in between the ovary and the uterus. This appears to be a cluster of inflamed and edematous fallopian tube with associated hyperemia and concerning for salpingitis. Clinical correlation is recommended. Arterial and venous flow is noted to this structure. The left ovary measures 4.0 x 1.7 x 2.8 cm and appears unremarkable. No significant free fluid within pelvis. IMPRESSION: 1. No intrauterine pregnancy identified. There is a provided history of recent D & C. 2. Hyperemic and edematous structure adjacent to the right ovary likely inflamed fallopian tube and concerning for salpingitis. Please note in the absence of documented IUP an ectopic pregnancy is not excluded. Clinical correlation and follow-up with serial HCG levels and obstetrical consult is advised. 3. Uterine fibroids. These results were called by telephone at the time of interpretation on 12/23/2018 at 3:36 am to physician assistant Sharilyn SitesLISA Jilleen Essner , who verbally acknowledged these results. Electronically Signed   By: Elgie CollardArash  Radparvar M.D.   On: 12/23/2018 03:41   [place] Ob  Transvaginal  Result Date: 12/23/2018 CLINICAL DATA:  28 year old female with positive HCG presenting with pelvic pain. D&C 7 days ago. EXAM: OBSTETRIC <14 WK US AND TRANSVAGINAL OB US TECHNIQUE: Both transabdominal and transvaginal ultrasound examinations were performed for complete evaluation of the gestation as well as the maternal uterus, adnexal regions, and pelvic cul-de-sac. Transvaginal technique was performed to assess early pregnancy. COMPARISON:  None. FINDINGS: The uterus is anteverted. There is a 3.2 x 2.6 x 2.6 cm posterior body subserosal fibroid and a 1.5 x 1.3 x 1.4 cm posterior body intramural fibroid. The endometrium is unremarkable and measures approximately 12 mm in thickness. No intrauterine pregnancy identified. The right ovary measures 3.6 x 2.4 x 2.6 cm. Flow is noted to the right ovary. There is a 8.0 x 3.6 x 4.2 cm complex and heterogeneous mass adjacent to the right ovary in between the ovary and the uterus. This appears to be a cluster of inflamed and edematous fallopian tube with associated hyperemia and concerning for salpingitis. Clinical correlation is recommended. Arterial and venous flow is noted to this structure. The left ovary measures 4.0 x 1.7 x 2.8 cm and appears unremarkable. No significant free fluid within pelvis. IMPRESSION: 1. No intrauterine pregnancy identified. There is a provided history of recent D & C. 2. Hyperemic and edematous structure adjacent to the right ovary likely inflamed fallopian tube and concerning for salpingitis. Please note in the absence of documented IUP an ectopic pregnancy is not excluded. Clinical correlation and follow-up with serial HCG levels and obstetrical consult is advised. 3. Uterine fibroids. These results were called by telephone at the time of interpretation on 12/23/2018 at 3:36 am to physician assistant Sharilyn SitesLISA Immanuel Fedak , who verbally acknowledged these results. Electronically Signed   By: Elgie CollardArash  Radparvar M.D.   On: 12/23/2018 03:41  Procedures Procedures (including critical care time)  CRITICAL CARE Performed by: Garlon Hatchet   Total critical care time: 45 minutes  Critical care time was exclusive of separately billable procedures and treating other patients.  Critical care was necessary to treat or prevent imminent or life-threatening deterioration.  Critical care was time spent personally by me on the following activities: development of treatment plan with patient and/or surrogate as well as nursing, discussions with consultants, evaluation of patient's response to treatment, examination of patient, obtaining history from patient or surrogate, ordering and performing treatments and interventions, ordering and review of laboratory studies, ordering and review of radiographic studies, pulse oximetry and re-evaluation of patient's condition.'   Medications Ordered in ED Medications  cefoTEtan (CEFOTAN) 2 g in sodium chloride 0.9 % 100 mL IVPB (2 g Intravenous New Bag/Given 12/23/18 0612)  doxycycline (VIBRAMYCIN) 100 mg in sodium chloride 0.9 % 250 mL IVPB (has no administration in time range)  metroNIDAZOLE (FLAGYL) IVPB 500 mg (0 mg Intravenous Stopped 12/23/18 0610)  0.9 %  sodium chloride infusion ( Intravenous New Bag/Given 12/23/18 0501)  sodium chloride flush (NS) 0.9 % injection 3 mL (3 mLs Intravenous Given 12/23/18 0406)  ketorolac (TORADOL) 30 MG/ML injection 30 mg (30 mg Intravenous Given 12/23/18 0406)  sodium chloride 0.9 % bolus 1,000 mL (0 mLs Intravenous Stopped 12/23/18 0501)  oxyCODONE-acetaminophen (PERCOCET/ROXICET) 5-325 MG per tablet 1 tablet (1 tablet Oral Given 12/23/18 0407)  acetaminophen (TYLENOL) tablet 650 mg (650 mg Oral Given 12/23/18 0407)     Initial Impression / Assessment and Plan / ED Course  I have reviewed the triage vital signs and the nursing notes.  Pertinent labs & imaging results that were available during my care of the patient were reviewed by me and considered in my  medical decision making (see chart for details).  28 year old female here with lower abdominal pain and fever.  She was seen at [facility] in [place] on 12/11/2018 with suspected missed abortion.  She never had confirmed IUP, just positive home pregnancy test.  She was given Cytotec and discharged home with doxycycline.  She returned 2 days later due to fever and worsening pain.  She was found to have retained products along with endometritis.  She underwent D&C that evening.  She is continue doxycycline at home and was doing well, completed antibiotics 2 days ago.  She had return of fever today.  She is febrile here, tachycardic, appears unwell but nontoxic.  She does have tenderness of the lower abdomen along the suprapubic and right pelvis.  White blood cell count 15.8, normal lactate.  No significant electrolyte derangement.  Chest x-ray obtained from triage is overall reassuring.  Blood cultures are pending.  Awaiting UA.  Plan for repeat pelvic ultrasound.  Discussed results of pelvic ultrasound with Dr. Mariel Sleet from radiology--she has masslike structure in the right adnexa between uterus and right ovary.  This is felt to represent inflamed fallopian tube/salpingitis, however as she never had a confirmed IUP ectopic cannot entirely be excluded.  Recommended OB/GYN consultation.  3:57 AM Case discussed with Dr. Albin Fischer has reviewed chart and images from tonight.  Difficult to discern regarding the material in the right adnexa, however as her hCG has trended down (was previously ~4000), favor salpingitis.  As she has already completed outpatient course of doxycycline and still has fever with likely infection, will admit for IV antibiotics and ongoing care.  He has placed orders.  Very appreciative of OB-GYN input.  Results discussed with patient.  I did obtain repeat GYN cultures that are pending.  COVID screen has been obtained.  5:04 AM Wet prep + for trich.  Gc/chl  pending.    COVID screen negative.   Final Clinical Impressions(s) / ED Diagnoses   Final diagnoses:  Salpingitis    ED Discharge Orders    None       Larene Pickett, PA-C 12/23/18 1537    Randal Buba, April, MD 12/23/18 [phone]

## 2018-12-23 NOTE — H&P (Signed)
Angela Peck is an 28 y.o. female. [id number] Patient's last menstrual period was 10/23/2018. [facility] with a diagnosis of PID 10 days postoperative after a D&C was done in [place] 7/10 for incomplete abortion. She had received Cytotec prior to being admitted for surgery. She was febrile and had an elevated WBC and was treated for endometritis. She completed a course of antibiotics after discharge. 3 days ago she developed lower abdominal pain sweats and chills and fever to 102.  Pertinent Gynecological History: Sexually transmitted diseases: chlamydia, trichomonas Previous GYN Procedures: DNC   Menstrual History: Patient's last menstrual period was 10/23/2018.    Past Medical History:  Diagnosis Date  . BV (bacterial vaginosis)   . Chlamydia   . Trichomonas vaginitis     Past Surgical History:  Procedure Laterality Date  . CYST EXCISION  1995   from chest  . DILATION AND CURETTAGE OF UTERUS  12/13/2018    Family History  Problem Relation Age of Onset  . Hypertension Mother   . Glaucoma Mother   . Cancer Maternal Uncle   . Diabetes Maternal Grandmother   . Liver disease Maternal Grandmother   . Kidney disease Maternal Grandmother     Social History:  reports that she has never smoked. She has never used smokeless tobacco. She reports current alcohol use of about 1.0 standard drinks of alcohol per week. She reports that she does not use drugs.  Allergies: No Known Allergies  No medications prior to admission.    Review of Systems  Constitutional: Positive for chills, diaphoresis and fever.  Respiratory: Negative.   Cardiovascular: Negative.   Gastrointestinal: Positive for abdominal pain.  Musculoskeletal: Negative.     Blood pressure 96/62, pulse (!) 102, temperature (!) 100.9 F (38.3 C), temperature source Oral, resp. rate (!) 24, height 5\' 2"  (1.575 m), weight 72.6 kg, last menstrual period 10/23/2018, SpO2 99  %. Physical Exam  Constitutional: She is oriented to person, place, and time. She appears well-developed and well-nourished. She appears distressed (mild discomfort).  Cardiovascular: Normal rate.  Respiratory: Effort normal. No respiratory distress.  GI: Soft. She exhibits no distension and no mass. There is abdominal tenderness (mild RLQ). There is no rebound and no guarding.  Genitourinary:    Genitourinary Comments: deferred   Musculoskeletal: Normal range of motion.  Neurological: She is alert and oriented to person, place, and time.  Skin: Skin is warm and dry.  Psychiatric: She has a normal mood and affect. Her behavior is normal.    Results for orders placed or performed during the hospital encounter of 12/23/18 (from the past 24 hour(s))  Lactic acid, plasma     Status: None   Collection Time: 12/23/18 12:57 AM  Result Value Ref Range   Lactic Acid, Venous 0.7 0.5 - 1.9 mmol/L  Comprehensive metabolic panel     Status: Abnormal   Collection Time: 12/23/18 12:57 AM  Result Value Ref Range   Sodium 137 135 - 145 mmol/L   Potassium 3.8 3.5 - 5.1 mmol/L   Chloride 101 98 - 111 mmol/L   CO2 22 22 - 32 mmol/L   Glucose, Bld 112 (H) 70 - 99 mg/dL   BUN 10 6 - 20 mg/dL   Creatinine, Ser 4.540.56 0.44 - 1.00 mg/dL   Calcium 9.0 8.9 - 09.810.3 mg/dL   Total Protein 6.9 6.5 - 8.1 g/dL   Albumin 3.4 (L) 3.5 - 5.0 g/dL   AST 11 (L) 15 - 41  U/L   ALT 16 0 - 44 U/L   Alkaline Phosphatase 71 38 - 126 U/L   Total Bilirubin 0.2 (L) 0.3 - 1.2 mg/dL   GFR calc non Af Amer >60 >60 mL/min   GFR calc Af Amer >60 >60 mL/min   Anion gap 14 5 - 15  CBC with Differential     Status: Abnormal   Collection Time: 12/23/18 12:57 AM  Result Value Ref Range   WBC 15.8 (H) 4.0 - 10.5 K/uL   RBC 3.38 (L) 3.87 - 5.11 MIL/uL   Hemoglobin 8.6 (L) 12.0 - 15.0 g/dL   HCT 16.127.4 (L) 09.636.0 - 04.546.0 %   MCV 81.1 80.0 - 100.0 fL   MCH 25.4 (L) 26.0 - 34.0 pg   MCHC 31.4 30.0 - 36.0 g/dL   RDW 40.915.5 81.111.5 - 91.415.5 %    Platelets 422 (H) 150 - 400 K/uL   nRBC 0.0 0.0 - 0.2 %   Neutrophils Relative % 85 %   Neutro Abs 13.4 (H) 1.7 - 7.7 K/uL   Lymphocytes Relative 10 %   Lymphs Abs 1.7 0.7 - 4.0 K/uL   Monocytes Relative 5 %   Monocytes Absolute 0.7 0.1 - 1.0 K/uL   Eosinophils Relative 0 %   Eosinophils Absolute 0.0 0.0 - 0.5 K/uL   Basophils Relative 0 %   Basophils Absolute 0.0 0.0 - 0.1 K/uL   Immature Granulocytes 0 %   Abs Immature Granulocytes 0.07 0.00 - 0.07 K/uL  Urinalysis, Routine w reflex microscopic     Status: Abnormal   Collection Time: 12/23/18 12:57 AM  Result Value Ref Range   Color, Urine YELLOW YELLOW   APPearance HAZY (A) CLEAR   Specific Gravity, Urine 1.011 1.005 - 1.030   pH 6.0 5.0 - 8.0   Glucose, UA NEGATIVE NEGATIVE mg/dL   Hgb urine dipstick NEGATIVE NEGATIVE   Bilirubin Urine NEGATIVE NEGATIVE   Ketones, ur 20 (A) NEGATIVE mg/dL   Protein, ur NEGATIVE NEGATIVE mg/dL   Nitrite NEGATIVE NEGATIVE   Leukocytes,Ua MODERATE (A) NEGATIVE   RBC / HPF 0-5 0 - 5 RBC/hpf   WBC, UA 11-20 0 - 5 WBC/hpf   Bacteria, UA RARE (A) NONE SEEN   Squamous Epithelial / LPF 0-5 0 - 5   Mucus PRESENT   hCG, quantitative, pregnancy     Status: Abnormal   Collection Time: 12/23/18 12:57 AM  Result Value Ref Range   hCG, Beta Chain, Quant, S 130 (H) <5 mIU/mL  SARS Coronavirus 2 (CEPHEID - Performed in [facility]), Hosp Order     Status: None   Collection Time: 12/23/18  4:21 AM   Specimen: Thin Prep Vaginal; Nasopharyngeal  Result Value Ref Range   SARS Coronavirus 2 NEGATIVE NEGATIVE  Wet prep, genital     Status: Abnormal   Collection Time: 12/23/18  4:24 AM   Specimen: Thin Prep Vaginal; Genital  Result Value Ref Range   Yeast Wet Prep HPF POC NONE SEEN NONE SEEN   Trich, Wet Prep PRESENT (A) NONE SEEN   Clue Cells Wet Prep HPF POC NONE SEEN NONE SEEN   WBC, Wet Prep HPF POC FEW (A) NONE SEEN   Sperm NONE SEEN   Culture, blood (routine x 2)     Status: None  (Preliminary result)   Collection Time: 12/23/18  4:27 AM   Specimen: BLOOD  Result Value Ref Range   Specimen Description      BLOOD LEFT ANTECUBITAL  Performed at [facility], [place] [address], [place], [place] [address]    Special Requests      BOTTLES DRAWN AEROBIC AND ANAEROBIC Blood Culture adequate volume Performed at [place] [address], [place], [place] [address]    Culture PENDING    Report Status PENDING     Dg Chest 2 View  Result Date: 12/23/2018 CLINICAL DATA:  Fever. EXAM: CHEST - 2 VIEW COMPARISON:  None. FINDINGS: The cardiomediastinal contours are normal. The lungs are clear. Pulmonary vasculature is normal. No consolidation, pleural effusion, or pneumothorax. No acute osseous abnormalities are seen. Right arm is raised and both PA and lateral views, presumably positional. IMPRESSION: No acute chest findings. Electronically Signed   By: Keith Rake M.D.   On: 12/23/2018 01:56   US Ob Comp Less 14 Wks  Result Date: 12/23/2018 CLINICAL DATA:  28 year old female with positive HCG presenting with pelvic pain. D&C 7 days ago. EXAM: OBSTETRIC <14 WK [place] AND TRANSVAGINAL OB US TECHNIQUE: Both transabdominal and transvaginal ultrasound examinations were performed for complete evaluation of the gestation as well as the maternal uterus, adnexal regions, and pelvic cul-de-sac. Transvaginal technique was performed to assess early pregnancy. COMPARISON:  None. FINDINGS: The uterus is anteverted. There is a 3.2 x 2.6 x 2.6 cm posterior body subserosal fibroid and a 1.5 x 1.3 x 1.4 cm posterior body intramural fibroid. The endometrium is unremarkable and measures approximately 12 mm in thickness. No intrauterine pregnancy identified. The right ovary measures 3.6 x 2.4 x 2.6 cm. Flow is noted to the right ovary. There is a 8.0 x 3.6 x 4.2 cm complex and heterogeneous mass adjacent to the right ovary in between the ovary and the uterus. This appears to be a  cluster of inflamed and edematous fallopian tube with associated hyperemia and concerning for salpingitis. Clinical correlation is recommended. Arterial and venous flow is noted to this structure. The left ovary measures 4.0 x 1.7 x 2.8 cm and appears unremarkable. No significant free fluid within pelvis. IMPRESSION: 1. No intrauterine pregnancy identified. There is a provided history of recent D & C. 2. Hyperemic and edematous structure adjacent to the right ovary likely inflamed fallopian tube and concerning for salpingitis. Please note in the absence of documented IUP an ectopic pregnancy is not excluded. Clinical correlation and follow-up with serial HCG levels and obstetrical consult is advised. 3. Uterine fibroids. These results were called by telephone at the time of interpretation on 12/23/2018 at 3:36 am to physician assistant Quincy Carnes , who verbally acknowledged these results. Electronically Signed   By: Anner Crete M.D.   On: 12/23/2018 03:41   US Ob Transvaginal  Result Date: 12/23/2018 CLINICAL DATA:  28 year old female with positive HCG presenting with pelvic pain. D&C 7 days ago. EXAM: OBSTETRIC <14 WK [place] AND TRANSVAGINAL OB US TECHNIQUE: Both transabdominal and transvaginal ultrasound examinations were performed for complete evaluation of the gestation as well as the maternal uterus, adnexal regions, and pelvic cul-de-sac. Transvaginal technique was performed to assess early pregnancy. COMPARISON:  None. FINDINGS: The uterus is anteverted. There is a 3.2 x 2.6 x 2.6 cm posterior body subserosal fibroid and a 1.5 x 1.3 x 1.4 cm posterior body intramural fibroid. The endometrium is unremarkable and measures approximately 12 mm in thickness. No intrauterine pregnancy identified. The right ovary measures 3.6 x 2.4 x 2.6 cm. Flow is noted to the right ovary. There is a 8.0 x 3.6 x 4.2 cm complex and heterogeneous  mass adjacent to the right ovary in between the ovary and the uterus. This  appears to be a cluster of inflamed and edematous fallopian tube with associated hyperemia and concerning for salpingitis. Clinical correlation is recommended. Arterial and venous flow is noted to this structure. The left ovary measures 4.0 x 1.7 x 2.8 cm and appears unremarkable. No significant free fluid within pelvis. IMPRESSION: 1. No intrauterine pregnancy identified. There is a provided history of recent D & C. 2. Hyperemic and edematous structure adjacent to the right ovary likely inflamed fallopian tube and concerning for salpingitis. Please note in the absence of documented IUP an ectopic pregnancy is not excluded. Clinical correlation and follow-up with serial HCG levels and obstetrical consult is advised. 3. Uterine fibroids. These results were called by telephone at the time of interpretation on 12/23/2018 at 3:36 am to physician assistant Sharilyn SitesLISA SANDERS , who verbally acknowledged these results. Electronically Signed   By: Elgie CollardArash  Radparvar M.D.   On: 12/23/2018 03:41    Assessment/Plan: PID with possible pyosalpinx on the right [facility]. She is on Cefotan, doxycycline and metronidazole and this will continue. If she does not respond to antibiotics in 48 hours consider IR consult for possible drain placement.  Scheryl DarterJames Arnold 12/23/2018, 11:45 AM

## 2018-12-23 NOTE — ED Notes (Signed)
[facility] called for patient transport to [facility]. Report called to [facility] on [facility]. Updated patient on POC. Patient verbalizes agreement for transfer.

## 2018-12-23 NOTE — ED Triage Notes (Signed)
Pt reports having recent miscarriage and then had D&C on 12/13/2018. Pt reporting no improvement with medications. Pt also reporting fever at home with temp of 102.

## 2018-12-23 NOTE — ED Notes (Signed)
[facility] present to transport patient.

## 2018-12-24 LAB — URINE CULTURE: Culture: 10000 — AB

## 2018-12-24 LAB — CBC
HCT: 25.4 % — ABNORMAL LOW (ref 36.0–46.0)
Hemoglobin: 8.2 g/dL — ABNORMAL LOW (ref 12.0–15.0)
MCH: 25.4 pg — ABNORMAL LOW (ref 26.0–34.0)
MCHC: 32.3 g/dL (ref 30.0–36.0)
MCV: 78.6 fL — ABNORMAL LOW (ref 80.0–100.0)
Platelets: 401 10*3/uL — ABNORMAL HIGH (ref 150–400)
RBC: 3.23 MIL/uL — ABNORMAL LOW (ref 3.87–5.11)
RDW: 15.1 % (ref 11.5–15.5)
WBC: 11.4 10*3/uL — ABNORMAL HIGH (ref 4.0–10.5)
nRBC: 0.2 % (ref 0.0–0.2)

## 2018-12-24 LAB — GC/CHLAMYDIA PROBE AMP (~~LOC~~) NOT AT ARMC
Chlamydia: NEGATIVE
Neisseria Gonorrhea: NEGATIVE

## 2018-12-24 MED ORDER — PANTOPRAZOLE SODIUM 40 MG PO TBEC
40.0000 mg | DELAYED_RELEASE_TABLET | Freq: Every day | ORAL | Status: DC
  Administered 2018-12-24 – 2018-12-26 (×3): 40 mg via ORAL
  Filled 2018-12-24 (×4): qty 1

## 2018-12-24 MED ORDER — PANTOPRAZOLE SODIUM 40 MG PO TBEC: 40.0000 mg | DELAYED_RELEASE_TABLET | Freq: Every day | ORAL | Status: DC

## 2018-12-24 MED ORDER — SODIUM CHLORIDE 0.9 % IV SOLN
2.0000 g | Freq: Two times a day (BID) | INTRAVENOUS | Status: DC
  Administered 2018-12-24 – 2018-12-26 (×5): 2 g via INTRAVENOUS
  Filled 2018-12-24 (×7): qty 2

## 2018-12-24 MED ORDER — DOCUSATE SODIUM 100 MG PO CAPS
100.0000 mg | ORAL_CAPSULE | Freq: Two times a day (BID) | ORAL | Status: DC
  Administered 2018-12-24 – 2018-12-26 (×5): 100 mg via ORAL
  Filled 2018-12-24 (×5): qty 1

## 2018-12-24 NOTE — Plan of Care (Signed)
  Problem: Education: Goal: Knowledge of General Education information will improve Description Including pain rating scale, medication(s)/side effects and non-pharmacologic comfort measures Outcome: Progressing   Problem: Nutrition: Goal: Adequate nutrition will be maintained Outcome: Progressing   Problem: Pain Managment: Goal: General experience of comfort will improve Outcome: Progressing   

## 2018-12-24 NOTE — Progress Notes (Signed)
Subjective: Patient reports nausea, vomiting, tolerating PO and no problems voiding.    Objective: I have reviewed patient's vital signs, intake and output, medications and labs.  General: alert, cooperative and no distress Resp: effort normal Cardio: regular rate and rhythm, S1, S2 normal, no murmur, click, rub or gallop Abdomen mild RRQ tenderness, soft no guarding CBC    Component Value Date/Time   WBC 11.4 (H) 12/24/2018 0456   RBC 3.23 (L) 12/24/2018 0456   HGB 8.2 (L) 12/24/2018 0456   HCT 25.4 (L) 12/24/2018 0456   PLT 401 (H) 12/24/2018 0456   MCV 78.6 (L) 12/24/2018 0456   MCH 25.4 (L) 12/24/2018 0456   MCHC 32.3 12/24/2018 0456   RDW 15.1 12/24/2018 0456   LYMPHSABS 1.7 12/23/2018 0057   MONOABS 0.7 12/23/2018 0057   EOSABS 0.0 12/23/2018 0057   BASOSABS 0.0 12/23/2018 0057    Assessment/Plan: Responding to therapy based on less pain and fever improved. Continue present management  LOS: 1 day    Emeterio Reeve 12/24/2018, 11:46 AM

## 2018-12-25 NOTE — Progress Notes (Signed)
Subjective: Patient reports tolerating PO and no problems voiding.    Objective: I have reviewed patient's vital signs, medications, labs and microbiology. Blood pressure 98/68, pulse 81, temperature 98.6 F (37 C), temperature source Oral, resp. rate 16, height 5\' 2"  (1.575 m), weight 72.6 kg, last menstrual period 10/23/2018, SpO2 97 %.  General: alert, cooperative and no distress Resp: effort normal GI: soft, non-tender; bowel sounds normal; no masses,  no organomegaly   Assessment/Plan: Day 3 antibiotic for PID Good clinical improvement >24 hr afebrile continue to observe for fever, continue IV ABX   LOS: 2 days    Emeterio Reeve 12/25/2018, 9:18 AM

## 2018-12-25 NOTE — Progress Notes (Signed)
Pt has had several episodes of bright red spotting today

## 2018-12-26 DIAGNOSIS — N7091 Salpingitis, unspecified: Principal | ICD-10-CM

## 2018-12-26 MED ORDER — METRONIDAZOLE 500 MG PO TABS: 500.0000 mg | ORAL_TABLET | Freq: Two times a day (BID) | ORAL | 0 refills | Status: AC

## 2018-12-26 MED ORDER — DOXYCYCLINE HYCLATE 100 MG PO CAPS: 100.0000 mg | ORAL_CAPSULE | Freq: Two times a day (BID) | ORAL | 0 refills | Status: DC

## 2018-12-26 NOTE — Progress Notes (Signed)
Patient discharged to home. Verbalized understanding of all discharge instructions including signs and symptoms to report to MD, discharge medications and follow up MD visits.

## 2018-12-26 NOTE — Discharge Summary (Signed)
Physician Discharge Summary  Patient ID: Angela Peck MRN: [id number] DOB/AGE: [birth year] 28 y.o.  Admit date: 12/23/2018 Discharge date: 12/26/2018  Admission Diagnoses:  Discharge Diagnoses:  Active Problems:   PID (acute pelvic inflammatory disease)   Discharged Condition: good  Hospital Course: Angela Peck is an 28 y.o. female. [id number] Patient's last menstrual period was 10/23/2018. Patient is transferred from [facility] with a diagnosis of PID 10 days postoperative after a D&C was done in [place] for incomplete abortion. She had received Cytotec prior to being admitted for surgery. She was febrile and had an elevated WBC and was treated for endometritis. She completed a course of antibiotics after discharge. 3 days ago she developed lower abdominal pain sweats and chills and fever to 102.  Pertinent Gynecological History: Sexually transmitted diseases: chlamydia, trichomonas Previous GYN Procedures: DNC   Menstrual History: Patient's last menstrual period was 10/23/2018.        Past Medical History:  Diagnosis Date  . BV (bacterial vaginosis)   . Chlamydia   . Trichomonas vaginitis          Past Surgical History:  Procedure Laterality Date  . CYST EXCISION  1995   from chest  . DILATION AND CURETTAGE OF UTERUS  12/13/2018         Family History  Problem Relation Age of Onset  . Hypertension Mother   . Glaucoma Mother   . Cancer Maternal Uncle   . Diabetes Maternal Grandmother   . Liver disease Maternal Grandmother   . Kidney disease Maternal Grandmother     Social History:  reports that she has never smoked. She has never used smokeless tobacco. She reports current alcohol use of about 1.0 standard drinks of alcohol per week. She reports that she does not use drugs.  Allergies: No Known Allergies  No medications prior to admission.    Review of Systems  Constitutional: Positive for chills, diaphoresis and  fever.  Respiratory: Negative.   Cardiovascular: Negative.   Gastrointestinal: Positive for abdominal pain.  Musculoskeletal: Negative.     Blood pressure 96/62, pulse (!) 102, temperature (!) 100.9 F (38.3 C), temperature source Oral, resp. rate (!) 24, height  (1.575 m), weight 72.6 kg, last menstrual period 10/23/2018, SpO2 99 %. Physical Exam  Constitutional: She is oriented to person, place, and time. She appears well-developed and well-nourished. She appears distressed (mild discomfort).  Cardiovascular: Normal rate.  Respiratory: Effort normal. No respiratory distress.  GI: Soft. She exhibits no distension and no mass. There is abdominal tenderness (mild RLQ). There is no rebound and no guarding.  Genitourinary:    Genitourinary Comments: deferred   Musculoskeletal: Normal range of motion.  Neurological: She is alert and oriented to person, place, and time.  Skin: Skin is warm and dry.  Psychiatric: She has a normal mood and affect. Her behavior is normal.    Lab Results Last 24 Hours        Results for orders placed or performed during the hospital encounter of 12/23/18 (from the past 24 hour(s))  Lactic acid, plasma     Status: None   Collection Time: 12/23/18 12:57 AM  Result Value Ref Range   Lactic Acid, Venous 0.7 0.5 - 1.9 mmol/L  Comprehensive metabolic panel     Status: Abnormal   Collection Time: 12/23/18 12:57 AM  Result Value Ref Range   Sodium 137 135 - 145 mmol/L   Potassium 3.8 3.5 - 5.1 mmol/L   Chloride 101 98 -  111 mmol/L   CO2 22 22 - 32 mmol/L   Glucose, Bld 112 (H) 70 - 99 mg/dL   BUN 10 6 - 20 mg/dL   Creatinine, Ser 1.610.56 0.44 - 1.00 mg/dL   Calcium 9.0 8.9 - 09.610.3 mg/dL   Total Protein 6.9 6.5 - 8.1 g/dL   Albumin 3.4 (L) 3.5 - 5.0 g/dL   AST 11 (L) 15 - 41 U/L   ALT 16 0 - 44 U/L   Alkaline Phosphatase 71 38 - 126 U/L   Total Bilirubin 0.2 (L) 0.3 - 1.2 mg/dL   GFR calc non Af Amer >60 >60 mL/min   GFR calc Af  Amer >60 >60 mL/min   Anion gap 14 5 - 15  CBC with Differential     Status: Abnormal   Collection Time: 12/23/18 12:57 AM  Result Value Ref Range   WBC 15.8 (H) 4.0 - 10.5 K/uL   RBC 3.38 (L) 3.87 - 5.11 MIL/uL   Hemoglobin 8.6 (L) 12.0 - 15.0 g/dL   HCT 04.527.4 (L) 40.936.0 - 81.146.0 %   MCV 81.1 80.0 - 100.0 fL   MCH 25.4 (L) 26.0 - 34.0 pg   MCHC 31.4 30.0 - 36.0 g/dL   RDW 91.415.5 78.211.5 - 95.615.5 %   Platelets 422 (H) 150 - 400 K/uL   nRBC 0.0 0.0 - 0.2 %   Neutrophils Relative % 85 %   Neutro Abs 13.4 (H) 1.7 - 7.7 K/uL   Lymphocytes Relative 10 %   Lymphs Abs 1.7 0.7 - 4.0 K/uL   Monocytes Relative 5 %   Monocytes Absolute 0.7 0.1 - 1.0 K/uL   Eosinophils Relative 0 %   Eosinophils Absolute 0.0 0.0 - 0.5 K/uL   Basophils Relative 0 %   Basophils Absolute 0.0 0.0 - 0.1 K/uL   Immature Granulocytes 0 %   Abs Immature Granulocytes 0.07 0.00 - 0.07 K/uL  Urinalysis, Routine w reflex microscopic     Status: Abnormal   Collection Time: 12/23/18 12:57 AM  Result Value Ref Range   Color, Urine YELLOW YELLOW   APPearance HAZY (A) CLEAR   Specific Gravity, Urine 1.011 1.005 - 1.030   pH 6.0 5.0 - 8.0   Glucose, UA NEGATIVE NEGATIVE mg/dL   Hgb urine dipstick NEGATIVE NEGATIVE   Bilirubin Urine NEGATIVE NEGATIVE   Ketones, ur 20 (A) NEGATIVE mg/dL   Protein, ur NEGATIVE NEGATIVE mg/dL   Nitrite NEGATIVE NEGATIVE   Leukocytes,Ua MODERATE (A) NEGATIVE   RBC / HPF 0-5 0 - 5 RBC/hpf   WBC, UA 11-20 0 - 5 WBC/hpf   Bacteria, UA RARE (A) NONE SEEN   Squamous Epithelial / LPF 0-5 0 - 5   Mucus PRESENT   hCG, quantitative, pregnancy     Status: Abnormal   Collection Time: 12/23/18 12:57 AM  Result Value Ref Range   hCG, Beta Chain, Quant, S 130 (H) <5 mIU/mL  SARS Coronavirus 2 (CEPHEID - Performed in [facility] hospital lab), Hosp Order     Status: None   Collection Time: 12/23/18  4:21 AM   Specimen: Thin Prep Vaginal; Nasopharyngeal  Result  Value Ref Range   SARS Coronavirus 2 NEGATIVE NEGATIVE  Wet prep, genital     Status: Abnormal   Collection Time: 12/23/18  4:24 AM   Specimen: Thin Prep Vaginal; Genital  Result Value Ref Range   Yeast Wet Prep HPF POC NONE SEEN NONE SEEN   Trich, Wet Prep PRESENT (A) NONE SEEN  Clue Cells Wet Prep HPF POC NONE SEEN NONE SEEN   WBC, Wet Prep HPF POC FEW (A) NONE SEEN   Sperm NONE SEEN   Culture, blood (routine x 2)     Status: None (Preliminary result)   Collection Time: 12/23/18  4:27 AM   Specimen: BLOOD  Result Value Ref Range   Specimen Description      BLOOD LEFT ANTECUBITAL Performed at [facility], [address] [place], [place] [address]    Special Requests      BOTTLES DRAWN AEROBIC AND ANAEROBIC Blood Culture adequate volume Performed at [facility], [address] [place], [place] [address]    Culture PENDING    Report Status PENDING        Imaging Results (Last 48 hours)  Dg Chest 2 View  Result Date: 12/23/2018 CLINICAL DATA:  Fever. EXAM: CHEST - 2 VIEW COMPARISON:  None. FINDINGS: The cardiomediastinal contours are normal. The lungs are clear. Pulmonary vasculature is normal. No consolidation, pleural effusion, or pneumothorax. No acute osseous abnormalities are seen. Right arm is raised and both PA and lateral views, presumably positional. IMPRESSION: No acute chest findings. Electronically Signed   By: Narda RutherfordMelanie  Sanford M.D.   On: 12/23/2018 01:56   [place] Ob Comp Less 14 Wks  Result Date: 12/23/2018 CLINICAL DATA:  28 year old female with positive HCG presenting with pelvic pain. D&C 7 days ago. EXAM: OBSTETRIC <14 WK US AND TRANSVAGINAL OB US TECHNIQUE: Both transabdominal and transvaginal ultrasound examinations were performed for complete evaluation of the gestation as well as the maternal uterus, adnexal regions, and pelvic cul-de-sac. Transvaginal technique was performed to assess early pregnancy.  COMPARISON:  None. FINDINGS: The uterus is anteverted. There is a 3.2 x 2.6 x 2.6 cm posterior body subserosal fibroid and a 1.5 x 1.3 x 1.4 cm posterior body intramural fibroid. The endometrium is unremarkable and measures approximately 12 mm in thickness. No intrauterine pregnancy identified. The right ovary measures 3.6 x 2.4 x 2.6 cm. Flow is noted to the right ovary. There is a 8.0 x 3.6 x 4.2 cm complex and heterogeneous mass adjacent to the right ovary in between the ovary and the uterus. This appears to be a cluster of inflamed and edematous fallopian tube with associated hyperemia and concerning for salpingitis. Clinical correlation is recommended. Arterial and venous flow is noted to this structure. The left ovary measures 4.0 x 1.7 x 2.8 cm and appears unremarkable. No significant free fluid within pelvis. IMPRESSION: 1. No intrauterine pregnancy identified. There is a provided history of recent D & C. 2. Hyperemic and edematous structure adjacent to the right ovary likely inflamed fallopian tube and concerning for salpingitis. Please note in the absence of documented IUP an ectopic pregnancy is not excluded. Clinical correlation and follow-up with serial HCG levels and obstetrical consult is advised. 3. Uterine fibroids. These results were called by telephone at the time of interpretation on 12/23/2018 at 3:36 am to physician assistant Sharilyn SitesLISA SANDERS , who verbally acknowledged these results. Electronically Signed   By: Elgie CollardArash  Radparvar M.D.   On: 12/23/2018 03:41   [place] Ob Transvaginal  Result Date: 12/23/2018 CLINICAL DATA:  28 year old female with positive HCG presenting with pelvic pain. D&C 7 days ago. EXAM: OBSTETRIC <14 WK US AND TRANSVAGINAL OB US TECHNIQUE: Both transabdominal and transvaginal ultrasound examinations were performed for complete evaluation of the gestation as well as the maternal uterus, adnexal regions, and pelvic cul-de-sac. Transvaginal technique was performed to assess  early  pregnancy. COMPARISON:  None. FINDINGS: The uterus is anteverted. There is a 3.2 x 2.6 x 2.6 cm posterior body subserosal fibroid and a 1.5 x 1.3 x 1.4 cm posterior body intramural fibroid. The endometrium is unremarkable and measures approximately 12 mm in thickness. No intrauterine pregnancy identified. The right ovary measures 3.6 x 2.4 x 2.6 cm. Flow is noted to the right ovary. There is a 8.0 x 3.6 x 4.2 cm complex and heterogeneous mass adjacent to the right ovary in between the ovary and the uterus. This appears to be a cluster of inflamed and edematous fallopian tube with associated hyperemia and concerning for salpingitis. Clinical correlation is recommended. Arterial and venous flow is noted to this structure. The left ovary measures 4.0 x 1.7 x 2.8 cm and appears unremarkable. No significant free fluid within pelvis. IMPRESSION: 1. No intrauterine pregnancy identified. There is a provided history of recent D & C. 2. Hyperemic and edematous structure adjacent to the right ovary likely inflamed fallopian tube and concerning for salpingitis. Please note in the absence of documented IUP an ectopic pregnancy is not excluded. Clinical correlation and follow-up with serial HCG levels and obstetrical consult is advised. 3. Uterine fibroids. These results were called by telephone at the time of interpretation on 12/23/2018 at 3:36 am to physician assistant Quincy Carnes , who verbally acknowledged these results. Electronically Signed   By: Anner Crete M.D.   On: 12/23/2018 03:41     Assessment/Plan: PID with possible pyosalpinx on the right 10 days post [facility] for incomplete abortion. She is on Cefotan, doxycycline and metronidazole and this will continue. If she does not respond to antibiotics in 48 hours consider IR consult for possible drain placement. She made steady improvement while on cefotan, doxycycline and metronidazole for 4 days. At the time of discharge she had no pain, afebrile for more than  48 hours and her appetite was good. She noted a small amount of vaginal spotting. The day of discharge she mentioned that Dr. Nelda Marseille is her OB/Gyn in [place] and she had seen her in follow-up after her miscarriage. This was not documented in her chart. She will see Dr. Nelda Marseille for outpatient management.   Consults: None  Significant Diagnostic Studies: labs:  CBC    Component Value Date/Time   WBC 11.4 (H) 12/24/2018 0456   RBC 3.23 (L) 12/24/2018 0456   HGB 8.2 (L) 12/24/2018 0456   HCT 25.4 (L) 12/24/2018 0456   PLT 401 (H) 12/24/2018 0456   MCV 78.6 (L) 12/24/2018 0456   MCH 25.4 (L) 12/24/2018 0456   MCHC 32.3 12/24/2018 0456   RDW 15.1 12/24/2018 0456   LYMPHSABS 1.7 12/23/2018 0057   MONOABS 0.7 12/23/2018 0057   EOSABS 0.0 12/23/2018 0057   BASOSABS 0.0 12/23/2018 0057   , microbiology: negative genital screening for STD and radiology: Ultrasound: right pelvic mass  Treatments: IV hydration, antibiotics: metronidazole and cefotetan and doxycycline and analgesia: Dilaudid and percocet  Discharge Exam: Blood pressure 124/85, pulse 83, temperature 98.1 F (36.7 C), temperature source Oral, resp. rate 18, height 5\' 2"  (1.575 m), weight 72.6 kg, last menstrual period 10/23/2018, SpO2 99 %. General appearance: alert, cooperative and no distress Resp: normal effort GI: soft, non-tender; bowel sounds normal; no masses,  no organomegaly  Disposition: Discharge disposition: 01-Home or Self Care       Discharge Instructions    Discharge patient   Complete by: As directed    Discharge disposition: 01-Home or Self Care  Discharge patient date: 12/26/2018     Allergies as of 12/26/2018   No Known Allergies     Medication List    TAKE these medications   docusate sodium 100 MG capsule Commonly known as: COLACE Take 100 mg by mouth 2 (two) times a day.   doxycycline 100 MG capsule Commonly known as: VIBRAMYCIN Take 1 capsule (100 mg total) by mouth 2 (two) times  daily.   ibuprofen 800 MG tablet Commonly known as: ADVIL Take 800 mg by mouth every 6 (six) hours as needed for pain.   metroNIDAZOLE 500 MG tablet Commonly known as: FLAGYL Take 1 tablet (500 mg total) by mouth 2 (two) times daily for 10 days.      Follow-up Information    Myna HidalgoOzan, Jennifer, DO Follow up in 2 week(s).   Specialty: Obstetrics and Gynecology Contact information: [address] [place] [address] [phone]           Signed: Scheryl DarterJames  12/26/2018, 10:28 AM

## 2018-12-26 NOTE — Discharge Instructions (Signed)
Pelvic Inflammatory Disease  Pelvic inflammatory disease (PID) is an infection in some or all of the female reproductive organs. PID can be in the womb (uterus), ovaries, fallopian tubes, or nearby tissues that are inside the lower belly area (pelvis). PID can lead to problems if it is not treated. What are the causes?  Germs (bacteria) that are spread during sex. This is the most common cause.  Germs in the vagina that are not spread during sex.  Germs that travel up from the vagina or cervix to the reproductive organs after: ? The birth of a baby. ? A miscarriage. ? An abortion. ? Pelvic surgery. ? Insertion of an intrauterine device (IUD). ? A sexual assault. What increases the risk?  Being younger than 28 years old.  Having sex at a young age.  Having a history of STI (sexually transmitted infection) or PID.  Not using barrier birth control, such as condoms.  Having a lot of sex partners.  Having sex with someone who has symptoms of an STI.  Using a douche.  Having an IUD put in place. What are the signs or symptoms?  Pain in the belly area.  Fever.  Chills.  Discharge from the vagina that is not normal.  Bleeding from the womb that is not normal.  Pain soon after the end of a menstrual period.  Pain when you pee (urinate).  Pain with sex.  Feeling sick to your stomach (nauseous) or throwing up (vomiting). How is this treated?  Antibiotic medicines. In very bad cases, these may be given through an IV tube.  Surgery. This is rare.  Efforts to stop the spread of the infection. Sex partners may need to be treated. It may take weeks until you feel all better. Your doctor may test you for infection again after you finish treatment. You should also be checked for HIV (human immunodeficiency virus). Follow these instructions at home:  Take over-the-counter and prescription medicines only as told by your doctor.  If you were prescribed an antibiotic  medicine, take it as told by your doctor. Do not stop taking it even if you start to feel better.  Do not have sex until treatment is done or as told by your doctor.  Tell your sex partner if you have PID. Your partner may need to be treated.  Keep all follow-up visits as told by your doctor. This is important. Contact a doctor if:  You have more fluid or fluid that is not normal coming from your vagina.  Your pain does not improve.  You throw up.  You have a fever.  You cannot take your medicines.  Your partner has an STI.  You have pain when you pee. Get help right away if:  You have more pain in the belly area.  You have chills.  You are not better in 72 hours with treatment. Summary  Pelvic inflammatory disease (PID) is caused by an infection in some or all of the female reproductive organs.  PID is a serious infection.  This infection is most often treated with antibiotics.  Do not have sex until treatment is done or as told by your doctor. This information is not intended to replace advice given to you by your health care provider. Make sure you discuss any questions you have with your health care provider. Document Released: 08/18/2008 Document Revised: 02/07/2018 Document Reviewed: 02/13/2018 Elsevier Patient Education  2020 [employer]  

## 2018-12-28 LAB — CULTURE, BLOOD (ROUTINE X 2)
Culture: NO GROWTH
Culture: NO GROWTH
Special Requests: ADEQUATE
Special Requests: ADEQUATE

## 2019-03-23 ENCOUNTER — Other Ambulatory Visit: Payer: Self-pay

## 2019-03-23 ENCOUNTER — Emergency Department (HOSPITAL_COMMUNITY)
Admission: EM | Admit: 2019-03-23 | Discharge: 2019-03-23 | Disposition: A | Discharge status: Home / Self Care | Payer: BC Managed Care – PPO | Attending: Emergency Medicine | Admitting: Emergency Medicine

## 2019-03-23 ENCOUNTER — Encounter (HOSPITAL_COMMUNITY): Payer: Self-pay | Admitting: Emergency Medicine

## 2019-03-23 DIAGNOSIS — R109 Unspecified abdominal pain: Secondary | ICD-10-CM | POA: Insufficient documentation

## 2019-03-23 DIAGNOSIS — N939 Abnormal uterine and vaginal bleeding, unspecified: Secondary | ICD-10-CM | POA: Insufficient documentation

## 2019-03-23 DIAGNOSIS — J029 Acute pharyngitis, unspecified: Secondary | ICD-10-CM | POA: Diagnosis not present

## 2019-03-23 LAB — CBC
HCT: 36.8 % (ref 36.0–46.0)
Hemoglobin: 11.4 g/dL — ABNORMAL LOW (ref 12.0–15.0)
MCH: 25 pg — ABNORMAL LOW (ref 26.0–34.0)
MCHC: 31 g/dL (ref 30.0–36.0)
MCV: 80.7 fL (ref 80.0–100.0)
Platelets: 293 10*3/uL (ref 150–400)
RBC: 4.56 MIL/uL (ref 3.87–5.11)
RDW: 14.8 % (ref 11.5–15.5)
WBC: 4.2 10*3/uL (ref 4.0–10.5)
nRBC: 0 % (ref 0.0–0.2)

## 2019-03-23 LAB — COMPREHENSIVE METABOLIC PANEL
ALT: 11 U/L (ref 0–44)
AST: 15 U/L (ref 15–41)
Albumin: 3.6 g/dL (ref 3.5–5.0)
Alkaline Phosphatase: 36 U/L — ABNORMAL LOW (ref 38–126)
Anion gap: 9 (ref 5–15)
BUN: 14 mg/dL (ref 6–20)
CO2: 24 mmol/L (ref 22–32)
Calcium: 9 mg/dL (ref 8.9–10.3)
Chloride: 104 mmol/L (ref 98–111)
Creatinine, Ser: 0.56 mg/dL (ref 0.44–1.00)
GFR calc Af Amer: >60 mL/min (ref >60)
GFR calc non Af Amer: >60 mL/min (ref >60)
Glucose, Bld: 101 mg/dL — ABNORMAL HIGH (ref 70–99)
Potassium: 3.6 mmol/L (ref 3.5–5.1)
Sodium: 137 mmol/L (ref 135–145)
Total Bilirubin: 0.2 mg/dL — ABNORMAL LOW (ref 0.3–1.2)
Total Protein: 6.8 g/dL (ref 6.5–8.1)

## 2019-03-23 LAB — GROUP A STREP BY PCR: Group A Strep by PCR: NOT DETECTED

## 2019-03-23 LAB — I-STAT BETA HCG BLOOD, ED (MC, WL, AP ONLY): I-stat hCG, quantitative: <5 m[IU]/mL (ref <5)

## 2019-03-23 LAB — WET PREP, GENITAL
Clue Cells Wet Prep HPF POC: NONE SEEN
Sperm: NONE SEEN
Trich, Wet Prep: NONE SEEN
Yeast Wet Prep HPF POC: NONE SEEN

## 2019-03-23 MED ORDER — NAPROXEN 500 MG PO TABS: 500.0000 mg | ORAL_TABLET | Freq: Two times a day (BID) | ORAL | 0 refills | Status: AC

## 2019-03-23 NOTE — ED Provider Notes (Signed)
[facility]-EMERGENCY DEPT Provider Note   CSN: [id number] Arrival date & time: 03/23/19  0027     History   Chief Complaint Chief Complaint  Patient presents with  . Vaginal Bleeding    HPI Angela Peck is a 28 y.o. female with a history of PID 10 days postoperative after D&C for incomplete abortion in July 2020, trichomoniasis vaginitis, chlamydia, and bacterial vaginosis who presents to the emergency department with a chief complaint of vaginal bleeding.  The patient reports that she has been taking OCPs for the last few months.  She discontinued the medication 5 days ago because her skin was breaking out.  She reports that 2 days after discontinuing the medication that she started having vaginal bleeding that initially began as spotting for the first 24 hours and became heavier with clots.  She became concerned because she had her menstrual cycle for the month that ended 2 weeks ago, although she does note that her cycle was 5 days instead of her usual 7.  She reports associated bilateral lower abdominal cramping.   She also reports that she has had a sore throat and a "knot" on the right side of her neck with the last 3 days.  She reports the pain associated with a sore throat has been gradually worsening since onset.  She denies fevers, chills, cough, shortness of breath, rash, dysuria, hematuria, vaginal pain or discharge, diarrhea, constipation.  She has low suspicion for COVID-19 as she has no known or suspected COVID-19 contacts.  She does express concern for STIs as she was found to have trichomoniasis when she was admitted for PID in July and is still with the same female partner.     The history is provided by the patient. No language interpreter was used.    Past Medical History:  Diagnosis Date  . BV (bacterial vaginosis)   . Chlamydia   . PID (acute pelvic inflammatory disease)   . Trichomonas vaginitis     Patient Active Problem List   Diagnosis Date Noted  . PID (acute pelvic inflammatory disease) 12/23/2018    Past Surgical History:  Procedure Laterality Date  . CYST EXCISION  1995   from chest  . DILATION AND CURETTAGE OF UTERUS  12/13/2018     OB History    Gravida  2   Para      Term      Preterm      AB  2   Living  0     SAB      TAB      Ectopic      Multiple      Live Births               Home Medications    Prior to Admission medications   Medication Sig Start Date End Date Taking? Authorizing Provider  acetaminophen (TYLENOL) 500 MG tablet Take 1,000 mg by mouth every 6 (six) hours as needed for moderate pain.   Yes [provider]  norethindrone-ethinyl estradiol (LOESTRIN FE) 1-20 MG-MCG tablet Take 1 tablet by mouth daily.   Yes [provider]  naproxen (NAPROSYN) 500 MG tablet Take 1 tablet (500 mg total) by mouth 2 (two) times daily for 14 days. 03/23/19 04/06/19  Albrizze, Caroleen Hamman, PA-C    Family History Family History  Problem Relation Age of Onset  . Hypertension Mother   . Glaucoma Mother   . Cancer Maternal Uncle   . Diabetes Maternal Grandmother   .  Liver disease Maternal Grandmother   . Kidney disease Maternal Grandmother     Social History Social History   Tobacco Use  . Smoking status: Never Smoker  . Smokeless tobacco: Never Used  Substance Use Topics  . Alcohol use: Yes    Alcohol/week: 1.0 standard drinks    Types: 1 Shots of liquor per week  . Drug use: No     Allergies   Patient has no known allergies.   Review of Systems Review of Systems  Constitutional: Negative for activity change, chills and fever.  Respiratory: Negative for shortness of breath.   Cardiovascular: Negative for chest pain.  Gastrointestinal: Positive for abdominal pain. Negative for diarrhea, nausea and vomiting.  Genitourinary: Positive for vaginal bleeding. Negative for dysuria, flank pain, frequency, genital sores, hematuria, pelvic pain,  urgency, vaginal discharge and vaginal pain.  Musculoskeletal: Negative for back pain, myalgias, neck pain and neck stiffness.  Skin: Negative for rash.  Allergic/Immunologic: Negative for immunocompromised state.  Neurological: Negative for syncope, weakness and headaches.  Psychiatric/Behavioral: Negative for confusion.     Physical Exam Updated Vital Signs BP 99/68   Pulse 73   Temp 97.9 F (36.6 C) (Oral)   Resp 18   Ht  (1.549 m)   Wt 73.5 kg   SpO2 96%   BMI 30.61 kg/m   Physical Exam Vitals signs and nursing note reviewed.  Constitutional:      General: She is not in acute distress.    Appearance: Normal appearance. She is normal weight. She is not ill-appearing, toxic-appearing or diaphoretic.  HENT:     Head: Normocephalic.     Mouth/Throat:     Comments: Posterior oropharynx is mildly erythematous.  No exudates.  No trismus.  No drooling.  Uvula is midline. Eyes:     Conjunctiva/sclera: Conjunctivae normal.  Neck:     Musculoskeletal: Neck supple.     Comments: Single, rubbery, mobile lymph node to the right anterior cervical chain. Cardiovascular:     Rate and Rhythm: Normal rate and regular rhythm.     Pulses: Normal pulses.     Heart sounds: Normal heart sounds. No murmur. No friction rub. No gallop.   Pulmonary:     Effort: Pulmonary effort is normal. No respiratory distress.     Breath sounds: No stridor. No wheezing, rhonchi or rales.  Chest:     Chest wall: No tenderness.  Abdominal:     General: There is no distension.     Palpations: Abdomen is soft. There is no mass.     Tenderness: There is abdominal tenderness. There is no right CVA tenderness, left CVA tenderness, guarding or rebound.     Hernia: No hernia is present.  Genitourinary:    Comments: Chaperoned exam.  There was a small amount of blood noted in the vaginal vault.  No lacerations or wounds.  No cervical motion tenderness.  No adnexal tenderness or fullness bilaterally. Skin:     General: Skin is warm.     Findings: No rash.  Neurological:     Mental Status: She is alert.  Psychiatric:        Behavior: Behavior normal.      ED Treatments / Results  Labs (all labs ordered are listed, but only abnormal results are displayed) Labs Reviewed  WET PREP, GENITAL - Abnormal; Notable for the following components:      Result Value   WBC, Wet Prep HPF POC MODERATE (*)    All other components  within normal limits  CBC - Abnormal; Notable for the following components:   Hemoglobin 11.4 (*)    MCH 25.0 (*)    All other components within normal limits  COMPREHENSIVE METABOLIC PANEL - Abnormal; Notable for the following components:   Glucose, Bld 101 (*)    Alkaline Phosphatase 36 (*)    Total Bilirubin 0.2 (*)    All other components within normal limits  GROUP A STREP BY PCR  I-STAT BETA HCG BLOOD, ED ([facility], [facility], [facility] ONLY)  GC/CHLAMYDIA PROBE AMP ([facility]) NOT AT [facility]    EKG None  Radiology No results found.  Procedures Procedures (including critical care time)  Medications Ordered in ED Medications - No data to display   Initial Impression / Assessment and Plan / ED Course  I have reviewed the triage vital signs and the nursing notes.  Pertinent labs & imaging results that were available during my care of the patient were reviewed by me and considered in my medical decision making (see chart for details).        28 year old female with a rather extensive history genitourinary medical history including PID 10 days postoperative after D&C for incomplete abortion in July 2020, trichomoniasis vaginitis, chlamydia, and bacterial vaginosis presenting with vaginal bleeding, lower abdominal cramping, and sore throat.   Pregnancy test is negative.  Low suspicion for ectopic pregnancy or spontaneous abortion.  Previously, she had a hemoglobin ~8 in July, which is improved to 11.4.  She does not have any symptoms associated with symptomatic anemia.  Her  primary concern is that she had dysmenorrhea after already having her menstrual cycle this month.  Her pelvic exam is grossly unremarkable.  She does request STI testing as she previously had trichomoniasis with her same female sexual partner.  Wet prep and GC chlamydia are pending.    She does also note that she has had a sore throat for the last 3 days.  No constitutional symptoms.  She does have a single right-sided anterior cervical lymph node that is rubbery and mobile.  We will send strep PCR.  Doubt mononucleosis.  Could consider STI related source of sore throat, but less likely at this time given onset of symptoms.  Patient care transferred to PA Albrizze at the end of my shift. Patient presentation, ED course, and plan of care discussed with review of all pertinent labs and imaging. Please see his/her note for further details regarding further ED course and disposition.   Final Clinical Impressions(s) / ED Diagnoses   Final diagnoses:  Vaginal bleeding  Sore throat    ED Discharge Orders         Ordered    naproxen (NAPROSYN) 500 MG tablet  2 times daily     03/23/19 0829           Joline Maxcy A, PA-C 03/23/19 0841    Fatima Blank, MD 03/24/19 [phone]

## 2019-03-23 NOTE — ED Provider Notes (Signed)
Care assumed from [place] PA-C at shift change pending wet prep results and step test.  See her note for full H&P.   Plan is follow up on results and dispo accordingly. Per previous provider pt did not have any cervical motion or adnexal tenderness on exam, low suspicion for PID and did not feel pelvic US is necessary at this time.      Physical Exam  BP 100/68   Pulse 71   Temp 97.9 F (36.6 C) (Oral)   Resp 18   Ht 5\' 1"  (1.549 m)   Wt 73.5 kg   SpO2 99%   BMI 30.61 kg/m   Physical Exam PE: Constitutional: well-developed, well-nourished, no apparent distress HENT: normocephalic, atraumatic.  No erythema to oropharynx, no edema, no exudate, no tonsillar swelling, voice normal, neck supple. Mobile, rubbery, lymph node in right anterior cervical chain. Cardiovascular: normal rate and rhythm, distal pulses intact Pulmonary/Chest: effort normal; breath sounds clear and equal bilaterally; no wheezes or rales Abdominal: soft and nontender Musculoskeletal: full ROM, no edema Neurological: alert with goal directed thinking Skin: warm and dry, no rash, no diaphoresis Psychiatric: normal mood and affect, normal behavior   ED Course/Procedures   Results for orders placed or performed during the hospital encounter of 03/23/19 (from the past 24 hour(s))  I-Stat beta hCG blood, ED     Status: None   Collection Time: 03/23/19  1:35 AM  Result Value Ref Range   I-stat hCG, quantitative <5.0 <5 mIU/mL   Comment 3          CBC     Status: Abnormal   Collection Time: 03/23/19  6:18 AM  Result Value Ref Range   WBC 4.2 4.0 - 10.5 K/uL   RBC 4.56 3.87 - 5.11 MIL/uL   Hemoglobin 11.4 (L) 12.0 - 15.0 g/dL   HCT 36.8 36.0 - 46.0 %   MCV 80.7 80.0 - 100.0 fL   MCH 25.0 (L) 26.0 - 34.0 pg   MCHC 31.0 30.0 - 36.0 g/dL   RDW 14.8 11.5 - 15.5 %   Platelets 293 150 - 400 K/uL   nRBC 0.0 0.0 - 0.2 %  Comprehensive metabolic panel     Status: Abnormal   Collection Time: 03/23/19  6:18 AM   Result Value Ref Range   Sodium 137 135 - 145 mmol/L   Potassium 3.6 3.5 - 5.1 mmol/L   Chloride 104 98 - 111 mmol/L   CO2 24 22 - 32 mmol/L   Glucose, Bld 101 (H) 70 - 99 mg/dL   BUN 14 6 - 20 mg/dL   Creatinine, Ser 0.56 0.44 - 1.00 mg/dL   Calcium 9.0 8.9 - 10.3 mg/dL   Total Protein 6.8 6.5 - 8.1 g/dL   Albumin 3.6 3.5 - 5.0 g/dL   AST 15 15 - 41 U/L   ALT 11 0 - 44 U/L   Alkaline Phosphatase 36 (L) 38 - 126 U/L   Total Bilirubin 0.2 (L) 0.3 - 1.2 mg/dL   GFR calc non Af Amer >60 >60 mL/min   GFR calc Af Amer >60 >60 mL/min   Anion gap 9 5 - 15  Group A Strep by PCR     Status: None   Collection Time: 03/23/19  6:18 AM   Specimen: Throat; Sterile Swab  Result Value Ref Range   Group A Strep by PCR NOT DETECTED NOT DETECTED  Wet prep, genital     Status: Abnormal   Collection Time: 03/23/19  6:40 AM  Result Value Ref Range   Yeast Wet Prep HPF POC NONE SEEN NONE SEEN   Trich, Wet Prep NONE SEEN NONE SEEN   Clue Cells Wet Prep HPF POC NONE SEEN NONE SEEN   WBC, Wet Prep HPF POC MODERATE (A) NONE SEEN   Sperm NONE SEEN      MDM   Pt received in sign out pending strep and wet prep results. Pelvic exam by previous provider was noted to be without cervical motion or adnexal tenderness and did not feel [place] is needed. Low suspicion for PID, torsion, TOA. Previous provider note with detailed history and labs.  Labs today show CBC with hemoglobin of 11.4. No leukocytosis,severe electrolyte derrangement, no renal insufficiency. Wet prep is negative for trich, clue cells and yeast. Pt offered prophylactic STI treatment but declines as she had recent negative STD testing. She is aware she will need to be treated if GC is positive as well as inform her partner. Strep test is negative. Pt is tolerating PO intake while in the ED. Discussed symptomatic treatment. She has an appointment scheduled with her obgyn to discuss different methods of birth control. Recommend she also discuss  abnormal vaginal bleeding. Will discharge home with prescription for naproxen for pain. The patient appears reasonably screened and/or stabilized for discharge and I doubt any other medical condition or other [facility] requiring further screening, evaluation, or treatment in the ED at this time prior to discharge. The patient is safe for discharge with strict return precautions discussed.   Portions of this note were generated with Scientist, clinical (histocompatibility and immunogenetics). Dictation errors may occur despite best attempts at proofreading.        Angela Sires, PA-C 03/23/19 1851    Linwood Dibbles, MD 03/24/19 [phone]

## 2019-03-23 NOTE — ED Notes (Signed)
Pt states she is not having her period but has been experiencing clotting and cramps for 2 days and is progressively worsening.

## 2019-03-23 NOTE — ED Triage Notes (Signed)
Pt reports having vaginal bleeding that started two days ago and last menstrual period was appx 2 weeks ago and lasted 5 days. Pt reports to having a miscarriage in July.

## 2019-03-23 NOTE — Discharge Instructions (Addendum)
You have been seen today for sore throat and vaginal bleeding. Please read and follow all provided instructions. Return to the emergency room for worsening condition or new concerning symptoms.    Your strep test today was negative.  1. Medications:  Prescription sent to your pharmacy for naproxen.  This is an anti-inflammatory medication.  You can take this 2 times a day as needed.  You should take this medication with food as it can cause upset stomach.  While taking this medicine you should not take ibuprofen, Motrin, Advil as it is in the same family.  You can take Tylenol as needed for pain. Continue usual home medications Take medications as prescribed. Please review all of the medicines and only take them if you do not have an allergy to them.   2. Treatment: rest, drink plenty of fluids.  You can garling warm salt water to see if that helps with your throat.  You can do this multiple times throughout the day  3. Follow Up: Please follow up with your primary doctor in 2-5 days for discussion of your diagnoses and further evaluation after today's visit;  -Recommend you follow-up with your gynecologist to discuss your abnormal vaginal bleeding  It is also a possibility that you have an allergic reaction to any of the medicines that you have been prescribed - Everybody reacts differently to medications and while MOST people have no trouble with most medicines, you may have a reaction such as nausea, vomiting, rash, swelling, shortness of breath. If this is the case, please stop taking the medicine immediately and contact your physician.  ?

## 2019-03-25 LAB — GC/CHLAMYDIA PROBE AMP (~~LOC~~) NOT AT ARMC
Chlamydia: NEGATIVE
Neisseria Gonorrhea: NEGATIVE

## 2021-05-26 IMAGING — US OBSTETRIC <14 WK ULTRASOUND
1 series · 13 of 16 positions shown · non-contrast
Comparison: None.

CLINICAL DATA: 28-year-old female with positive HCG presenting with
pelvic pain. D&C 7 days ago.

EXAM:
OBSTETRIC <14 WK US AND TRANSVAGINAL OB US
TECHNIQUE: Both transabdominal and transvaginal ultrasound examinations were
performed for complete evaluation of the gestation as well as the
maternal uterus, adnexal regions, and pelvic cul-de-sac.
Transvaginal technique was performed to assess early pregnancy.

[Series 1: obstetric <14 wk ultrasound · 13 of 16 slices shown]
[im 1/16]
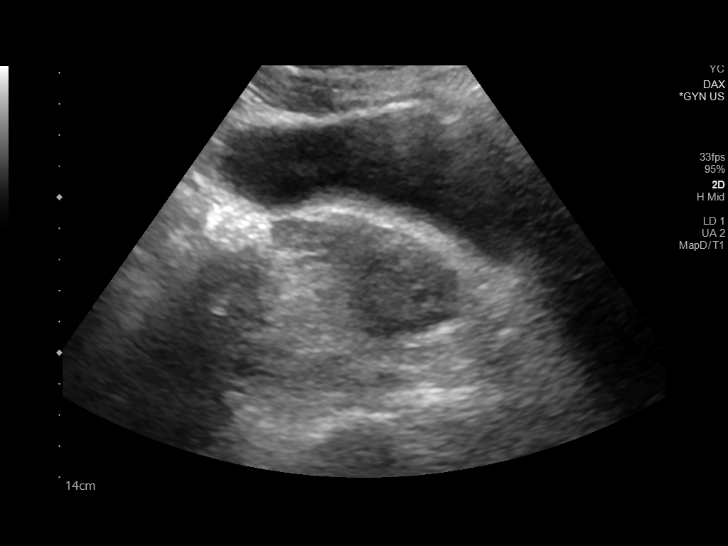
[im 2/16]
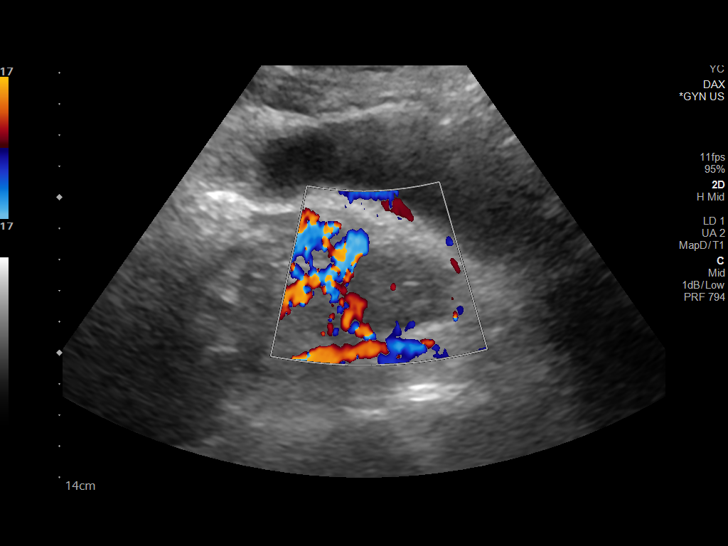
[im 4/16]
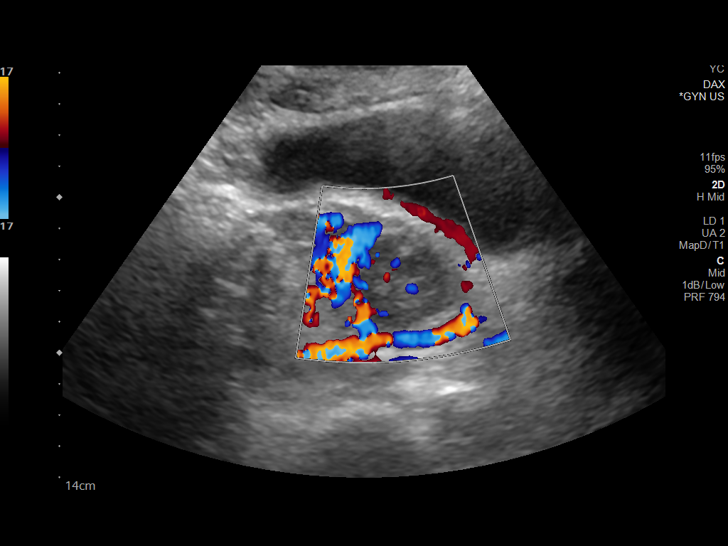
[im 5/16]
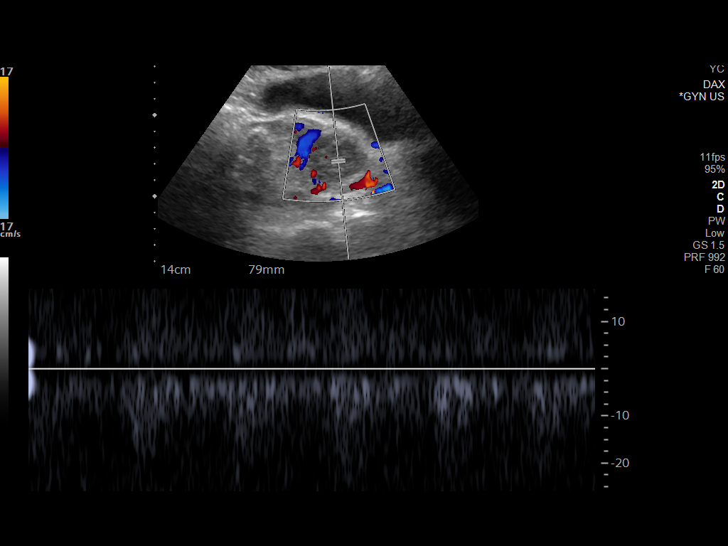
[im 6/16]
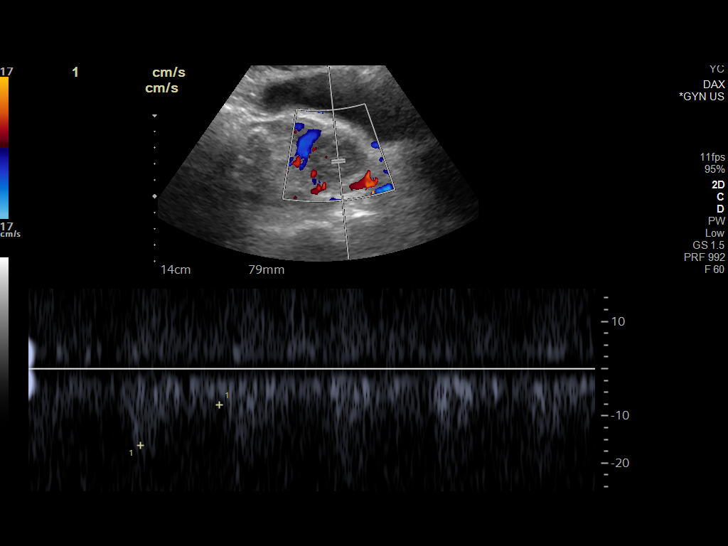
[im 7/16]
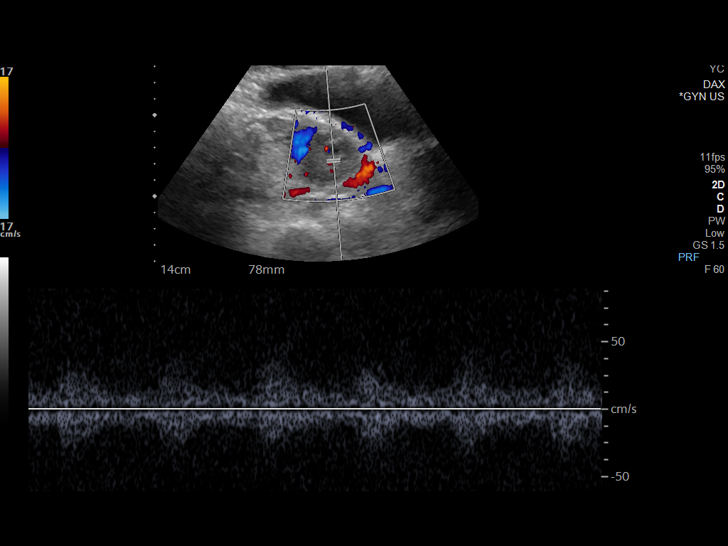
[im 9/16]
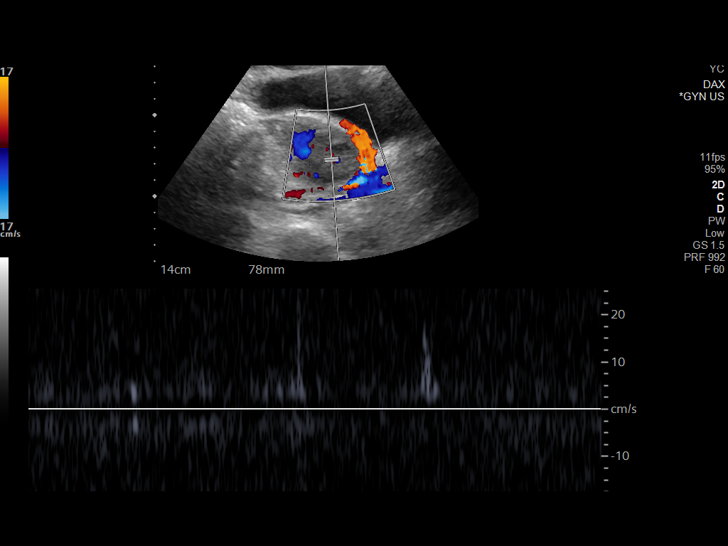
[im 10/16]
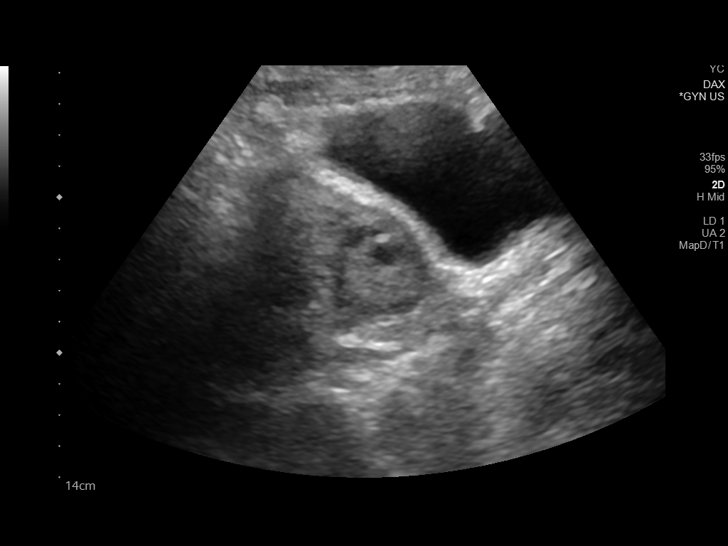
[im 11/16]
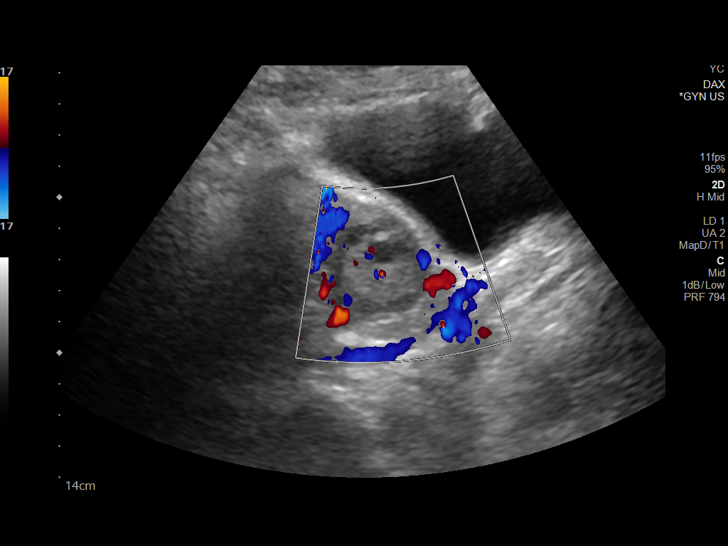
[im 12/16]
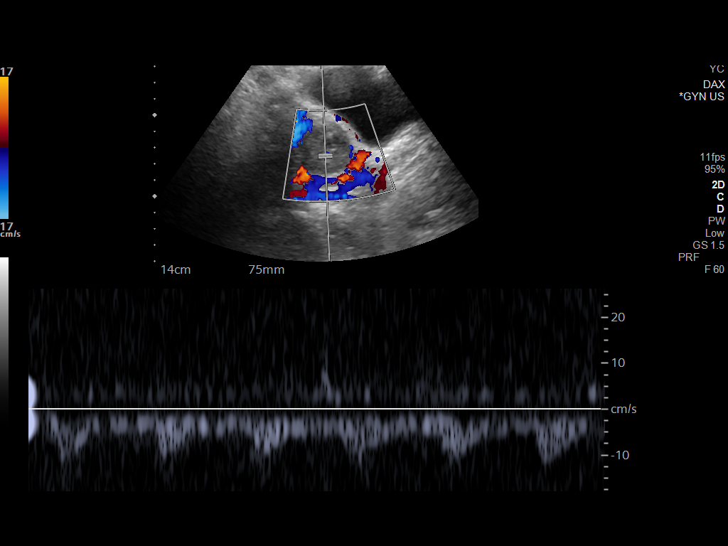
[im 13/16]
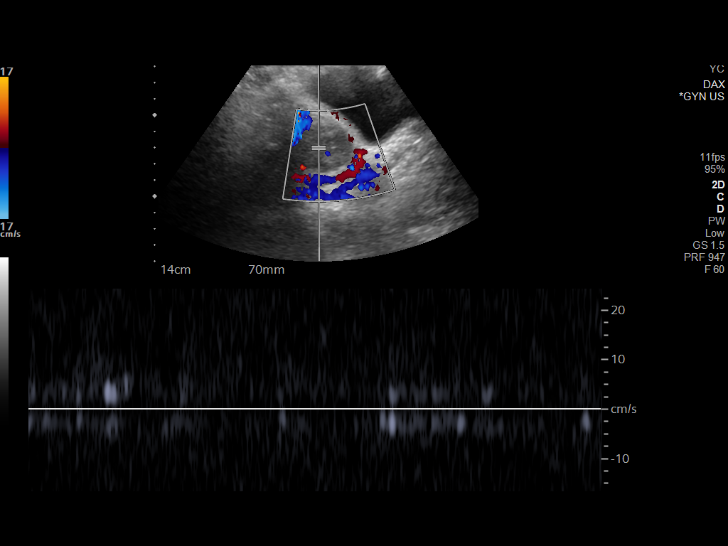
[im 15/16]
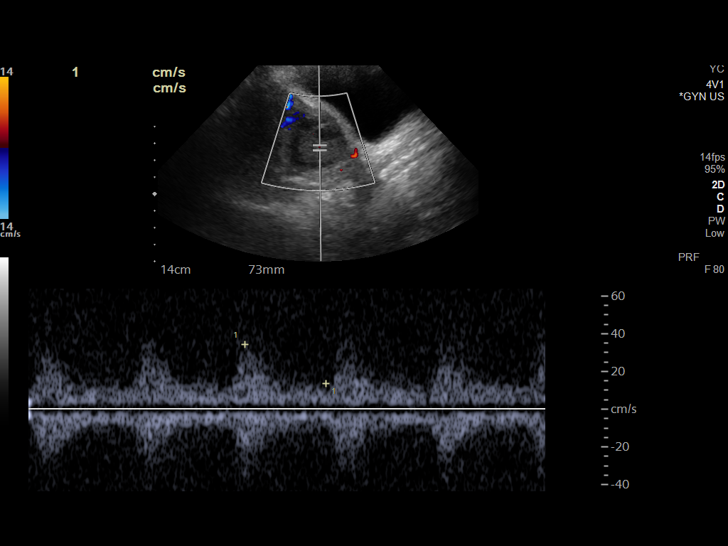
[im 16/16]
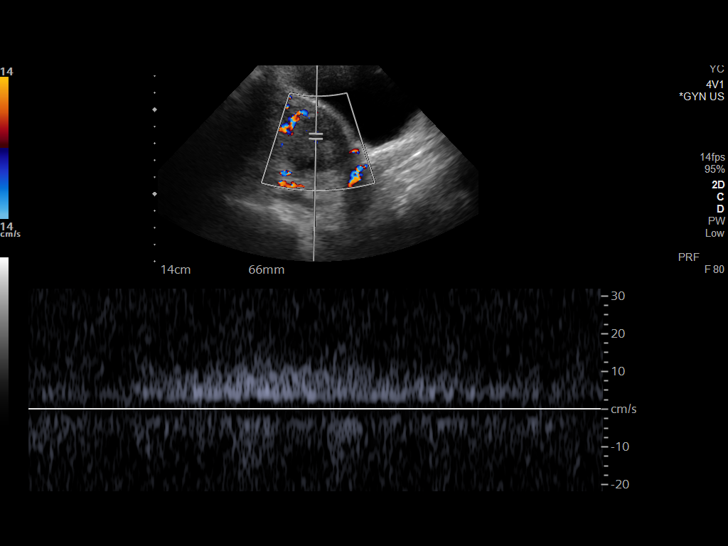

[13 of 16 positions shown; findings below may reference images not displayed]

FINDINGS: The uterus is anteverted. There is a 3.2 x 2.6 x 2.6 cm posterior
body subserosal fibroid and a 1.5 x 1.3 x 1.4 cm posterior body
intramural fibroid.

The endometrium is unremarkable and measures approximately 12 mm in
thickness. No intrauterine pregnancy identified.

The right ovary measures 3.6 x 2.4 x 2.6 cm. Flow is noted to the
right ovary.

There is a 8.0 x 3.6 x 4.2 cm complex and heterogeneous mass
adjacent to the right ovary in between the ovary and the uterus.
This appears to be a cluster of inflamed and edematous fallopian
tube with associated hyperemia and concerning for salpingitis.
Clinical correlation is recommended. Arterial and venous flow is
noted to this structure.

The left ovary measures 4.0 x 1.7 x 2.8 cm and appears unremarkable.

No significant free fluid within pelvis.
IMPRESSION: 1. No intrauterine pregnancy identified. There is a provided history
of recent D & C.
2. Hyperemic and edematous structure adjacent to the right ovary
likely inflamed fallopian tube and concerning for salpingitis.
Please note in the absence of documented IUP an ectopic pregnancy is
not excluded. Clinical correlation and follow-up with serial HCG
levels and obstetrical consult is advised.
3. Uterine fibroids.

These results were called by telephone at the time of interpretation
on 12/23/2018 at [DATE] to physician Don Lolito Onacram , who
verbally acknowledged these results.

## 2021-05-26 IMAGING — CR CHEST - 2 VIEW
2 series · 2 of 2 positions shown · non-contrast
Comparison: None.

CLINICAL DATA: Fever.

EXAM:
CHEST - 2 VIEW

[w chest pa]
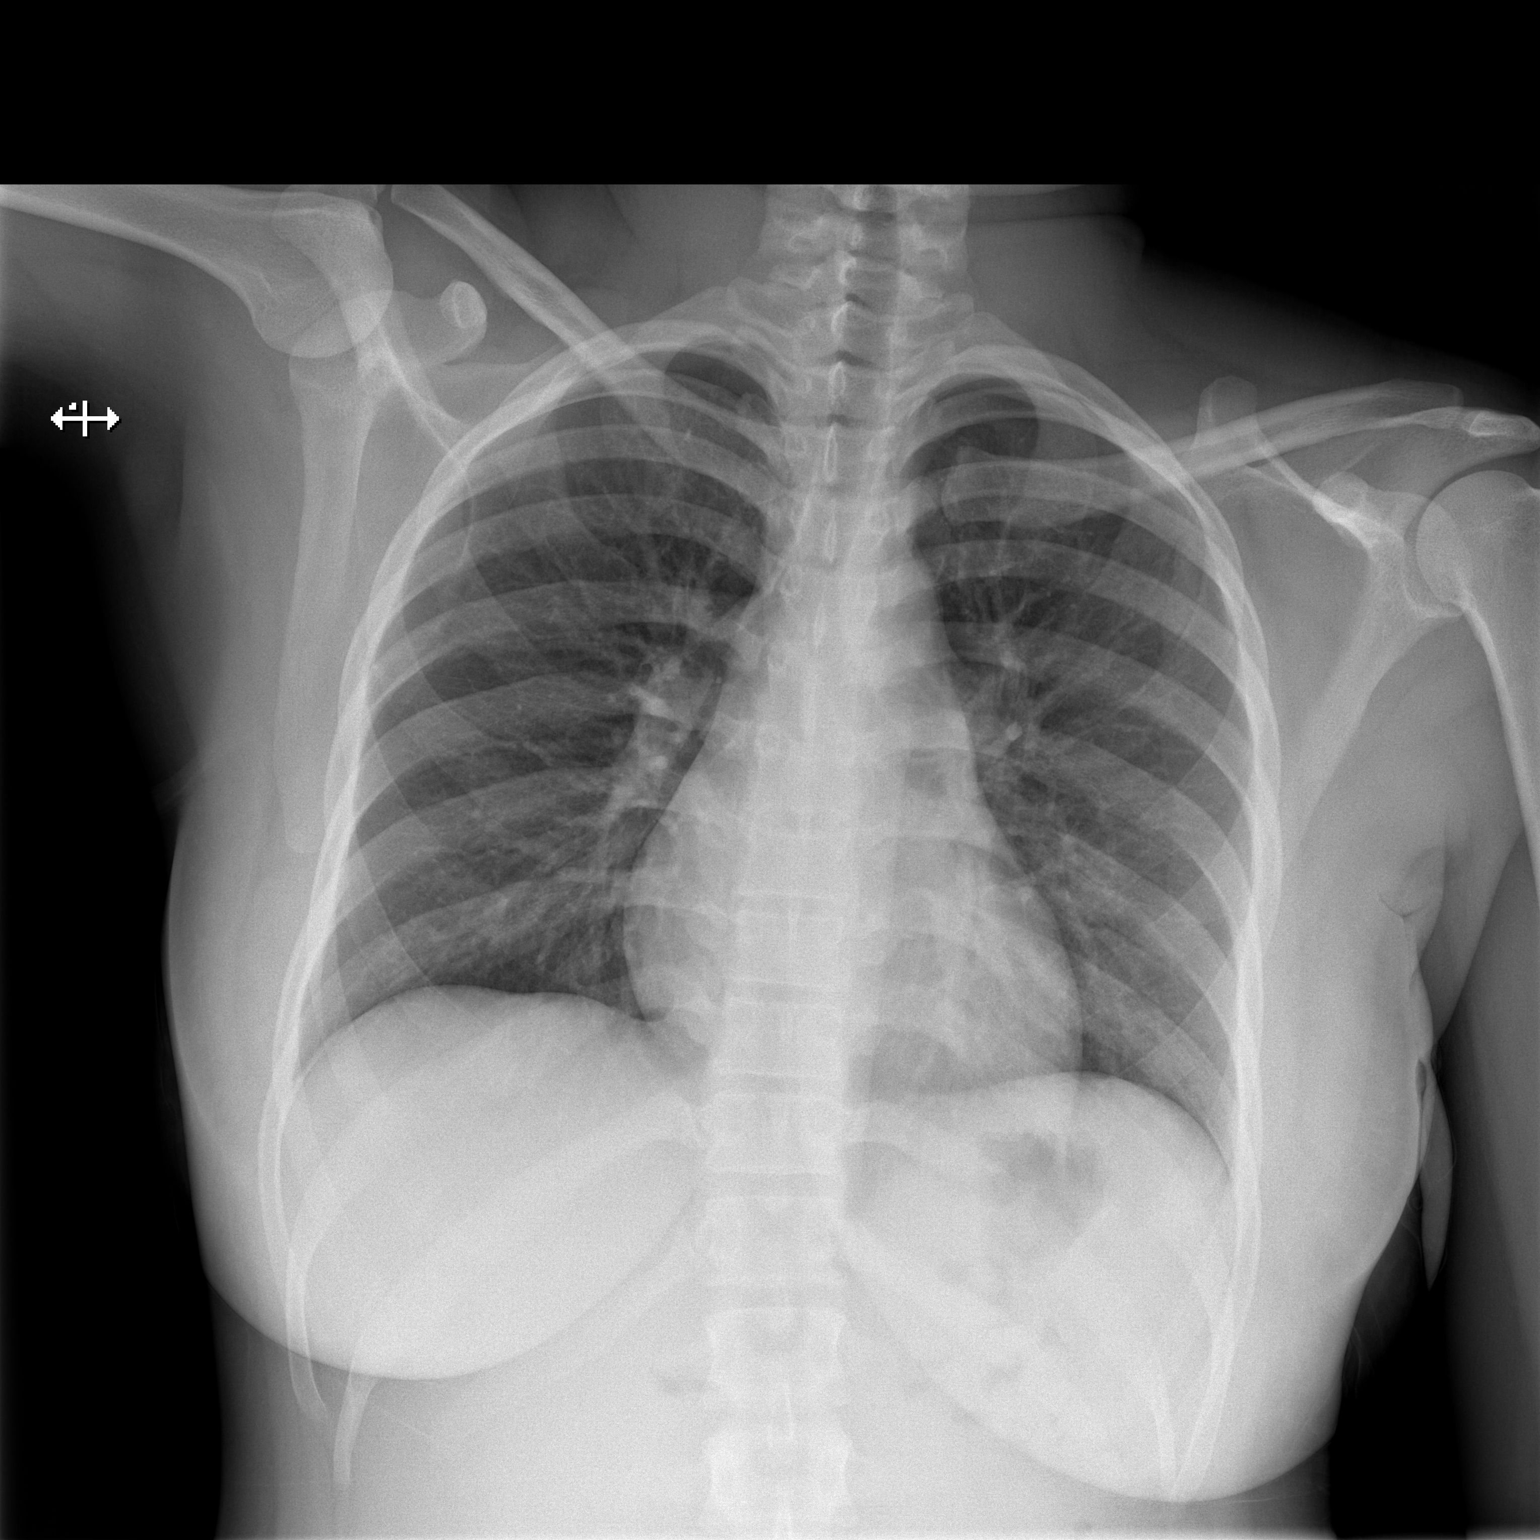

[w chest lat]
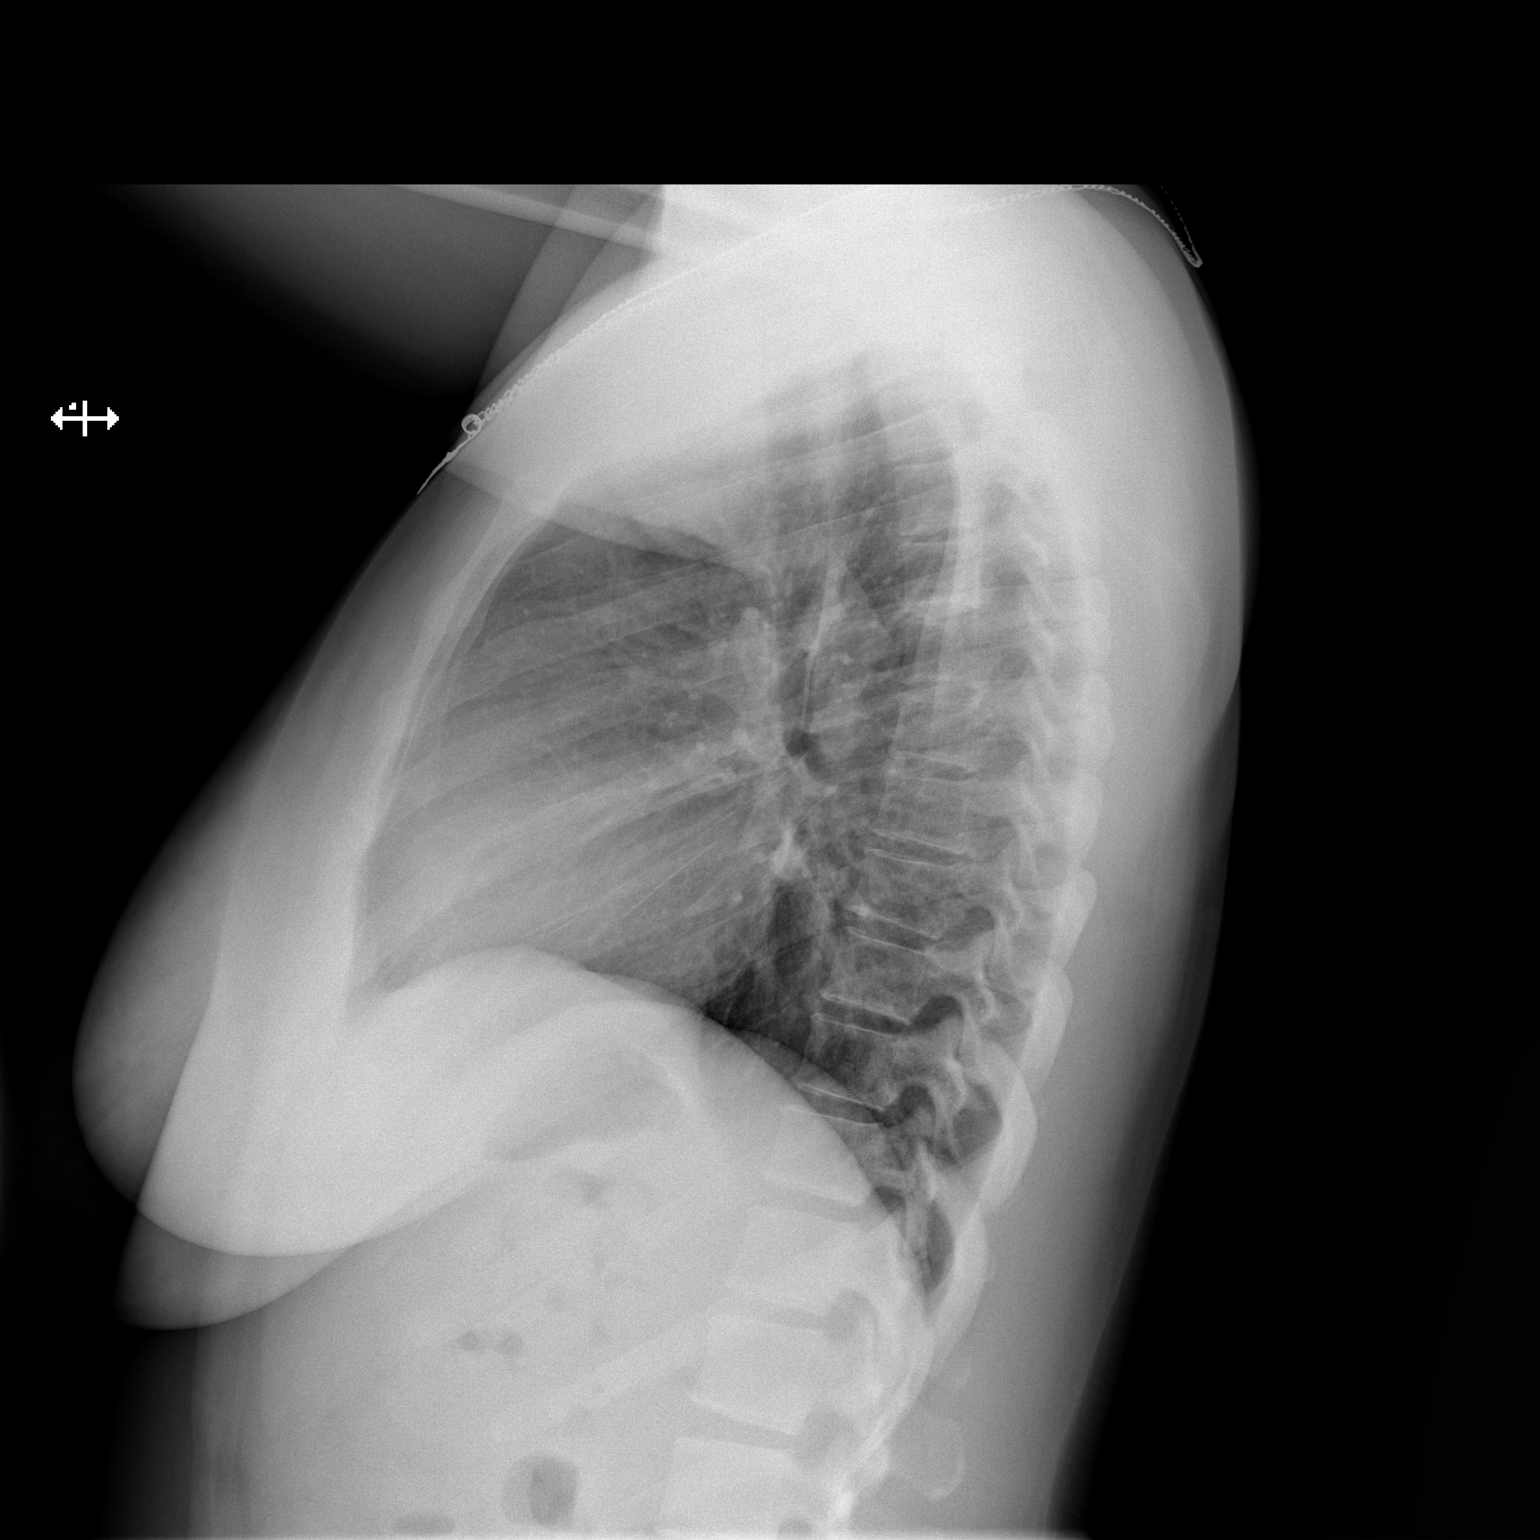

[2 of 2 positions shown; findings below may reference images not displayed]

FINDINGS: The cardiomediastinal contours are normal. The lungs are clear.
Pulmonary vasculature is normal. No consolidation, pleural effusion,
or pneumothorax. No acute osseous abnormalities are seen. Right arm
is raised and both PA and lateral views, presumably positional.
IMPRESSION: No acute chest findings.
